# Patient Record
Sex: Female | Born: 1995 | Race: White | Hispanic: No | Smoking: Never smoker
Health system: Southern US, Community
[De-identification: ages and names within clinical notes are randomized; demographics above are authoritative.]

## PROBLEM LIST (undated history)

## (undated) ENCOUNTER — Inpatient Hospital Stay (HOSPITAL_COMMUNITY): Payer: PRIVATE HEALTH INSURANCE

## (undated) DIAGNOSIS — H539 Unspecified visual disturbance: Secondary | ICD-10-CM

---
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
## 2019-01-27 DIAGNOSIS — N854 Malposition of uterus: Secondary | ICD-10-CM | POA: Insufficient documentation

## 2019-01-27 DIAGNOSIS — R030 Elevated blood-pressure reading, without diagnosis of hypertension: Secondary | ICD-10-CM | POA: Insufficient documentation

## 2020-04-12 ENCOUNTER — Emergency Department (HOSPITAL_COMMUNITY): Payer: No Typology Code available for payment source

## 2020-04-12 ENCOUNTER — Emergency Department (HOSPITAL_COMMUNITY)
Admission: EM | Admit: 2020-04-12 | Discharge: 2020-04-12 | Disposition: A | Discharge status: Home / Self Care | Payer: No Typology Code available for payment source | Attending: Emergency Medicine | Admitting: Emergency Medicine

## 2020-04-12 DIAGNOSIS — R109 Unspecified abdominal pain: Secondary | ICD-10-CM | POA: Diagnosis present

## 2020-04-12 DIAGNOSIS — R102 Pelvic and perineal pain: Secondary | ICD-10-CM

## 2020-04-12 DIAGNOSIS — D72829 Elevated white blood cell count, unspecified: Secondary | ICD-10-CM | POA: Insufficient documentation

## 2020-04-12 DIAGNOSIS — N83291 Other ovarian cyst, right side: Secondary | ICD-10-CM | POA: Diagnosis not present

## 2020-04-12 DIAGNOSIS — N83209 Unspecified ovarian cyst, unspecified side: Secondary | ICD-10-CM

## 2020-04-12 DIAGNOSIS — R000 Tachycardia, unspecified: Secondary | ICD-10-CM | POA: Insufficient documentation

## 2020-04-12 LAB — I-STAT BETA HCG BLOOD, ED (MC, WL, AP ONLY): I-stat hCG, quantitative: <5 m[IU]/mL (ref <5)

## 2020-04-12 LAB — CBC WITH DIFFERENTIAL/PLATELET
Abs Immature Granulocytes: 0.01 10*3/uL (ref 0.00–0.07)
Basophils Absolute: 0 10*3/uL (ref 0.0–0.1)
Basophils Relative: 1 %
Eosinophils Absolute: 0.1 10*3/uL (ref 0.0–0.5)
Eosinophils Relative: 1 %
HCT: 39.1 % (ref 36.0–46.0)
Hemoglobin: 12.6 g/dL (ref 12.0–15.0)
Immature Granulocytes: 0 %
Lymphocytes Relative: 34 %
Lymphs Abs: 1.7 10*3/uL (ref 0.7–4.0)
MCH: 28.3 pg (ref 26.0–34.0)
MCHC: 32.2 g/dL (ref 30.0–36.0)
MCV: 87.7 fL (ref 80.0–100.0)
Monocytes Absolute: 0.4 10*3/uL (ref 0.1–1.0)
Monocytes Relative: 8 %
Neutro Abs: 2.8 10*3/uL (ref 1.7–7.7)
Neutrophils Relative %: 56 %
Platelets: 219 10*3/uL (ref 150–400)
RBC: 4.46 MIL/uL (ref 3.87–5.11)
RDW: 14.6 % (ref 11.5–15.5)
WBC: 4.9 10*3/uL (ref 4.0–10.5)
nRBC: 0 % (ref 0.0–0.2)

## 2020-04-12 LAB — URINALYSIS, ROUTINE W REFLEX MICROSCOPIC
Bacteria, UA: NONE SEEN
Bilirubin Urine: NEGATIVE
Glucose, UA: NEGATIVE mg/dL
Ketones, ur: 5 mg/dL — AB
Nitrite: NEGATIVE
Protein, ur: NEGATIVE mg/dL
Specific Gravity, Urine: 1.013 (ref 1.005–1.030)
pH: 6 (ref 5.0–8.0)

## 2020-04-12 LAB — COMPREHENSIVE METABOLIC PANEL
ALT: 29 U/L (ref 0–44)
AST: 31 U/L (ref 15–41)
Albumin: 4 g/dL (ref 3.5–5.0)
Alkaline Phosphatase: 45 U/L (ref 38–126)
Anion gap: 9 (ref 5–15)
BUN: 6 mg/dL (ref 6–20)
CO2: 21 mmol/L — ABNORMAL LOW (ref 22–32)
Calcium: 9.3 mg/dL (ref 8.9–10.3)
Chloride: 106 mmol/L (ref 98–111)
Creatinine, Ser: 0.73 mg/dL (ref 0.44–1.00)
GFR, Estimated: >60 mL/min (ref >60)
Glucose, Bld: 93 mg/dL (ref 70–99)
Potassium: 4 mmol/L (ref 3.5–5.1)
Sodium: 136 mmol/L (ref 135–145)
Total Bilirubin: 0.4 mg/dL (ref 0.3–1.2)
Total Protein: 7.1 g/dL (ref 6.5–8.1)

## 2020-04-12 LAB — LIPASE, BLOOD: Lipase: 33 U/L (ref 11–51)

## 2020-04-12 MED ORDER — HYDROCODONE-ACETAMINOPHEN 5-325 MG PO TABS
1.0000 | ORAL_TABLET | Freq: Once | ORAL | Status: AC
  Administered 2020-04-12: 1 via ORAL
  Filled 2020-04-12: qty 1

## 2020-04-12 NOTE — ED Notes (Signed)
Pt transported to CT ?

## 2020-04-12 NOTE — ED Provider Notes (Signed)
[facility] EMERGENCY DEPARTMENT Provider Note   CSN: [id number] Arrival date & time: 04/12/20  4696     History No chief complaint on file.   Kristina Arias is a 25 y.o. female.  HPI Patient is a 25 year old female who presents the emergency department due to right-sided abdominal pain.  Her symptoms started about 3 days ago.  They were initially intermittent and about 1 day ago they became constant and began worsening.  Her pain is 7/10.  She states it is along the right flank and right lateral abdomen.  Worsens with movement and mildly resolves when lying still.  She has no other somatic complaints.  Denies any chest pain, shortness of breath, fevers, chills, nausea, vomiting, dysuria, hematuria, vaginal discharge.  She notes a history of right-sided ovarian cyst removal but otherwise denies any abdominal surgeries.  Her last menstrual period was around March 4 and she states it was normal.    Past Medical History:  Diagnosis Date  . Vision abnormalities    wears glasses -also has lazy(L) eye and crossed (L) eye    There are no problems to display for this patient.   Past Surgical History:  Procedure Laterality Date  . LAPAROSCOPY  01/15/2011   Procedure: LAPAROSCOPY OPERATIVE;  Surgeon: Janine Limbo, MD;  Location: [facility];  Service: Gynecology;  Laterality: N/A;  . OVARIAN CYST REMOVAL  01/15/2011   Procedure: OVARIAN CYSTECTOMY;  Surgeon: Janine Limbo, MD;  Location: [facility];  Service: Gynecology;  Laterality: Right;  laprascopic ovarian cystectomy with lysis of adhesions     OB History   No obstetric history on file.     No family history on file.  Social History   Tobacco Use  . Smoking status: Never Smoker  Substance Use Topics  . Alcohol use: No    Home Medications Prior to Admission medications   Medication Sig Start Date End Date Taking? Authorizing Provider  JUNEL FE 1/20 1-20 MG-MCG tablet Take 1 tablet by mouth daily.  02/29/20  Yes [provider]  cimetidine (TAGAMET) 800 MG tablet Take 1 tablet (800 mg total) by mouth at bedtime. Patient not taking: No sig reported 06/07/13   Lenn Sink, DPM  ondansetron (ZOFRAN ODT) 4 MG disintegrating tablet Take 1 tablet (4 mg total) by mouth every 8 (eight) hours as needed for nausea or vomiting. Patient not taking: No sig reported 09/02/14   Francee Piccolo, PA-C    Allergies    Patient has no known allergies.  Review of Systems   Review of Systems  All other systems reviewed and are negative. Ten systems reviewed and are negative for acute change, except as noted in the HPI.    Physical Exam Updated Vital Signs BP 128/78   Pulse 77   Temp 98.4 F (36.9 C)   Resp (!) 25   SpO2 99%   Physical Exam Vitals and nursing note reviewed.  Constitutional:      General: She is not in acute distress.    Appearance: Normal appearance. She is not ill-appearing, toxic-appearing or diaphoretic.  HENT:     Head: Normocephalic and atraumatic.     Right Ear: External ear normal.     Left Ear: External ear normal.     Nose: Nose normal.     Mouth/Throat:     Mouth: Mucous membranes are moist.     Pharynx: Oropharynx is clear. No oropharyngeal exudate or posterior oropharyngeal erythema.  Eyes:  General: No scleral icterus.       Right eye: No discharge.        Left eye: No discharge.     Extraocular Movements: Extraocular movements intact.     Conjunctiva/sclera: Conjunctivae normal.  Cardiovascular:     Rate and Rhythm: Regular rhythm. Tachycardia present.     Pulses: Normal pulses.     Heart sounds: Normal heart sounds. No murmur heard. No friction rub. No gallop.   Pulmonary:     Effort: Pulmonary effort is normal. No respiratory distress.     Breath sounds: Normal breath sounds. No stridor. No wheezing, rhonchi or rales.  Abdominal:     General: Abdomen is flat.     Palpations: Abdomen is soft.     Tenderness: There is no abdominal  tenderness. There is no right CVA tenderness or left CVA tenderness.     Comments: Abdomen is flat, soft, and nontender.  No palpable flank pain.  Musculoskeletal:        General: Normal range of motion.     Cervical back: Normal range of motion and neck supple. No tenderness.  Skin:    General: Skin is warm and dry.  Neurological:     General: No focal deficit present.     Mental Status: She is alert and oriented to person, place, and time.  Psychiatric:        Mood and Affect: Mood normal.        Behavior: Behavior normal.    ED Results / Procedures / Treatments   Labs (all labs ordered are listed, but only abnormal results are displayed) Labs Reviewed  COMPREHENSIVE METABOLIC PANEL - Abnormal; Notable for the following components:      Result Value   CO2 21 (*)    All other components within normal limits  URINALYSIS, ROUTINE W REFLEX MICROSCOPIC - Abnormal; Notable for the following components:   APPearance HAZY (*)    Hgb urine dipstick SMALL (*)    Ketones, ur 5 (*)    Leukocytes,Ua LARGE (*)    All other components within normal limits  URINE CULTURE  LIPASE, BLOOD  CBC WITH DIFFERENTIAL/PLATELET  I-STAT BETA HCG BLOOD, ED ([facility], [facility], [facility] ONLY)   EKG None  Radiology CT Renal Stone Study  Result Date: 04/12/2020 CLINICAL DATA:  Right flank pain, suspected kidney stone EXAM: CT ABDOMEN AND PELVIS WITHOUT CONTRAST TECHNIQUE: Multidetector CT imaging of the abdomen and pelvis was performed following the standard protocol without IV contrast. COMPARISON:  03/12/2010 FINDINGS: Lower chest: No acute abnormality. Hepatobiliary: No solid liver abnormality is seen. No gallstones, gallbladder wall thickening, or biliary dilatation. Pancreas: Unremarkable. No pancreatic ductal dilatation or surrounding inflammatory changes. Spleen: Normal in size without significant abnormality. Adrenals/Urinary Tract: Adrenal glands are unremarkable. Small calculi of the inferior pole of the right  kidney (series 6, image 37). No left-sided calculi, ureteral calculi, or hydronephrosis. Bladder is unremarkable. Stomach/Bowel: Stomach is within normal limits. Appendix appears normal. No evidence of bowel wall thickening, distention, or inflammatory changes. Vascular/Lymphatic: No significant vascular findings are present. No enlarged abdominal or pelvic lymph nodes. Reproductive: No mass or other significant abnormality. Fluid attenuation functional cyst of the right ovary (series 3, image 65) Other: No abdominal wall hernia or abnormality. Trace, nonspecific fluid in the low pelvis. Musculoskeletal: No acute or significant osseous findings. IMPRESSION: 1. Small calculi of the inferior pole of the right kidney. No left-sided calculi, ureteral calculi, or hydronephrosis. 2. Fluid attenuation functional cyst of the right  ovary with trace, nonspecific fluid in the low pelvis, likely functional in the reproductive age setting. Electronically Signed   By: Lauralyn Primes M.D.   On: 04/12/2020 11:19   US PELVIC COMPLETE W TRANSVAGINAL AND TORSION R/O  Result Date: 04/12/2020 CLINICAL DATA:  Right pelvic pain EXAM: TRANSABDOMINAL AND TRANSVAGINAL ULTRASOUND OF PELVIS DOPPLER ULTRASOUND OF OVARIES TECHNIQUE: Both transabdominal and transvaginal ultrasound examinations of the pelvis were performed. Transabdominal technique was performed for global imaging of the pelvis including uterus, ovaries, adnexal regions, and pelvic cul-de-sac. It was necessary to proceed with endovaginal exam following the transabdominal exam to visualize the endometrium and ovaries bilaterally. Color and duplex Doppler ultrasound was utilized to evaluate blood flow to the ovaries. COMPARISON:  None. FINDINGS: Uterus Measurements: 7.0 x 4.7 x 5.6 cm = volume: 97 mL. The uterus is retroverted. No fibroids or other mass visualized. The cervix is unremarkable. Endometrium Thickness: 11 mm.  No focal abnormality visualized. Right ovary Measurements:  6.1 x 3.3 x 4.2 cm = volume: 45 mL. The right ovary is largely replaced by at least 5 simple cysts, the largest measuring 3.4 x 3.2 x 3.1 cm likely representing follicular cysts. A single cyst within the right ovary measures 16 x 16 x 17 mm and demonstrates layering echogenic debris demonstrating a concave margin on transverse imaging most in keeping with a small hemorrhagic cyst. No solid masses are identified. Left ovary Measurements: 3.7 x 1.7 x 3.1 cm = volume: 10 mL. Normal appearance/no adnexal mass. Pulsed Doppler evaluation of both ovaries demonstrates normal low-resistance arterial and venous waveforms. Other findings There is small complex appearing fluid within the right adnexa demonstrating echogenic debris likely representing proteinaceous or hemorrhagic debris. IMPRESSION: 17 mm hemorrhagic cyst within the right ovary with small surrounding fluid within the right adnexa likely representing hemorrhagic fluid in keeping with a probable ruptured hemorrhagic cyst. Multiple additional simple cysts within the right ovary measuring up to 3.4 cm. No follow up imaging recommended. Note: This recommendation does not apply to premenarchal patients or to those with increased risk (genetic, family history, elevated tumor markers or other high-risk factors) of ovarian cancer. Reference: Radiology 2019 Nov; 293(2):359-371. Electronically Signed   By: Helyn Numbers MD   On: 04/12/2020 14:14    Procedures Procedures   Medications Ordered in ED Medications  HYDROcodone-acetaminophen (NORCO/VICODIN) 5-325 MG per tablet 1 tablet (1 tablet Oral Given 04/12/20 1041)    ED Course  I have reviewed the triage vital signs and the nursing notes.  Pertinent labs & imaging results that were available during my care of the patient were reviewed by me and considered in my medical decision making (see chart for details).  Clinical Course as of 04/12/20 1445  Thu Apr 12, 2020  1435 US PELVIC COMPLETE W TRANSVAGINAL  AND TORSION R/O IMPRESSION: 17 mm hemorrhagic cyst within the right ovary with small surrounding fluid within the right adnexa likely representing hemorrhagic fluid in keeping with a probable ruptured hemorrhagic cyst.  Multiple additional simple cysts within the right ovary measuring up to 3.4 cm. No follow up imaging recommended. Note: This recommendation does not apply to premenarchal patients or to those with increased risk (genetic, family history, elevated tumor markers or other high-risk factors) of ovarian cancer. Reference: Radiology 2019 Nov; 293(2):359-371. [LJ]    Clinical Course User Index [LJ] Placido Sou, PA-C   MDM Rules/Calculators/A&P  Pt is a 25 y.o. female who presents the emergency department with right-sided abdominal pain and flank pain.  Labs: CBC without abnormalities. CMP with a CO2 of 21. Lipase of 33. I-STAT beta-hCG less than 5. UA showing small hemoglobin, 5 ketones, large leukocytes, 11-20 squamous epithelial cells.  Urine culture sent.  Imaging: CT scan without contrast of the abdomen and pelvis shows small calculi of the inferior pole the right kidney.  No left-sided calculi, ureteral calculi, or hydronephrosis.  There is fluid attenuation and a functional cyst of the right ovary with trace nonspecific fluid in the low pelvis. Ultrasound shows a 17 mm hemorrhagic cyst within the right ovary with small surrounding fluid within the right adnexa likely representing a hemorrhagic fluid in keeping with a probable ruptured hemorrhagic cyst.  I, Placido SouLogan Joldersma, PA-C, personally reviewed and evaluated these images and lab results as part of my medical decision-making.  Feel that patient's symptoms are likely secondary to a ruptured hemorrhagic cyst in the right ovary.  Imaging showed a 17 mm hemorrhagic cyst within the right ovary.  CT and ultrasound otherwise reassuring.  Discussed pain control with the patient.  She has a  gynecologist in town and is going to follow-up with them if her symptoms persist.  We discussed return precautions.  Her questions were answered and she was amicable at the time of discharge.  Note: Portions of this report may have been transcribed using voice recognition software. Every effort was made to ensure accuracy; however, inadvertent computerized transcription errors may be present.   Final Clinical Impression(s) / ED Diagnoses Final diagnoses:  Ruptured cyst of ovary   Rx / DC Orders ED Discharge Orders    None       Placido SouJoldersma, Logan, PA-C 04/12/20 1445    Virgina NorfolkCuratolo, Adam, DO 04/12/20 1503

## 2020-04-12 NOTE — ED Notes (Signed)
Pt d/c home per MD order. Discharge summary reviewed with pt, pt verbalizes understanding. No s/s of acute distress noted at discharge., Ambulatory. Reports she has discharge ride home.

## 2020-04-12 NOTE — ED Triage Notes (Signed)
Pt here with c/o right side flank pain that radiates around to front of her abd , no n/v

## 2020-04-12 NOTE — ED Notes (Signed)
Patient transported to CT 

## 2020-04-12 NOTE — Discharge Instructions (Signed)
I recommend a combination of tylenol and ibuprofen for management of your pain. You can take a low dose of both at the same time. I recommend 500 mg of Tylenol combined with 600 mg of ibuprofen. This is one maximum strength Tylenol and three regular ibuprofen. You can take these 2-3 times for day for your pain. Please try to take these medications with a small amount of food as well to prevent upsetting your stomach.  If you develop any new or worsening symptoms please do not hesitate to return to the emergency department.  Otherwise, please follow-up with your gynecologist regarding your symptoms.  It was a pleasure to meet you.

## 2020-04-13 LAB — URINE CULTURE

## 2021-04-04 ENCOUNTER — Ambulatory Visit: Payer: No Typology Code available for payment source | Admitting: Family

## 2021-05-16 ENCOUNTER — Ambulatory Visit: Payer: BC Managed Care – PPO | Admitting: Family

## 2021-05-16 ENCOUNTER — Encounter: Payer: Self-pay | Admitting: Family

## 2021-05-16 VITALS — BP 122/84 | HR 93 | Temp 99.0°F | Resp 16 | Ht 66.0 in | Wt 171.2 lb

## 2021-05-16 DIAGNOSIS — Z13220 Encounter for screening for lipoid disorders: Secondary | ICD-10-CM | POA: Diagnosis not present

## 2021-05-16 DIAGNOSIS — Z0001 Encounter for general adult medical examination with abnormal findings: Secondary | ICD-10-CM | POA: Diagnosis not present

## 2021-05-16 DIAGNOSIS — N83201 Unspecified ovarian cyst, right side: Secondary | ICD-10-CM | POA: Diagnosis not present

## 2021-05-16 DIAGNOSIS — J029 Acute pharyngitis, unspecified: Secondary | ICD-10-CM | POA: Insufficient documentation

## 2021-05-16 DIAGNOSIS — D509 Iron deficiency anemia, unspecified: Secondary | ICD-10-CM | POA: Diagnosis not present

## 2021-05-16 DIAGNOSIS — J00 Acute nasopharyngitis [common cold]: Secondary | ICD-10-CM | POA: Insufficient documentation

## 2021-05-16 LAB — POCT RAPID STREP A (OFFICE): Rapid Strep A Screen: NEGATIVE

## 2021-05-16 NOTE — Patient Instructions (Addendum)
please check into status of HPV vaccine and or tdap vaccination.  ? ?Continue nightly zyrtec, start using flonase nose spray daily.  ? ?Welcome to our clinic, I am happy to have you as my new patient. I am excited to continue on this healthcare journey with you. ? ?Stop by the lab prior to leaving today. I will notify you of your results once received.  ? ?Please keep in mind ?Any my chart messages you send have p to a three business day turnaround for a response.  ?Phone calls may have up to a one day business turnaround for a  response.  ? ?If you need a medication refill I recommend you request it through the pharmacy as this is easiest for [place] rather than sending a message and or phone call.  ? ?Due to recent changes in healthcare laws, you may see results of your imaging and/or laboratory studies on MyChart before I have had a chance to review them.  I understand that in some cases there may be results that are confusing or concerning to you. Please understand that not all results are received at the same time and often I may need to interpret multiple results in order to provide you with the best plan of care or course of treatment. Therefore, I ask that you please give me 2 business days to thoroughly review all your results before contacting my office for clarification. Should we see a critical lab result, you will be contacted sooner.  ? ?It was a pleasure seeing you today! Please do not hesitate to reach out with any questions and or concerns. ? ?Regards,  ? ?Zaryah Seckel ?FNP-C ? ?

## 2021-05-16 NOTE — Progress Notes (Signed)
? ?New Patient Office Visit ? ?Subjective:  ?Patient ID: Kristina Arias, female    DOB: [birth year]  Age: 26 y.o. MRN: [id number] ? ?CC:  ?Chief Complaint  ?Patient presents with  ? Establish Care  ? ? ?HPI ?Kristina Arias is here to establish care as a new patient. ? ?Prior provider was: Maryellen Pile, Pediatrician. Last visit with PCP 2016 ?Pt is without acute concerns.  ? ?Wears glasses and contacts all the time to see.  ?Tetanus vaccine, unsure if she has had in the last ten years.  ?Hpv vaccination, unsure of status.  ? ?Pt states for the last four days with fullness of bil ears, feels cloudy, nasal congestion, headache, and sore throat that is starting to improve. She does have tonsil stones which have since improved. No cough. No fever no chills.  ?Seems to be improving but only very slight.  ?Taking dayquil with no relief.  ?At home covid test negative.  ? ? ?  05/16/2021  ?  4:08 PM  ?GAD 7 : Generalized Anxiety Score  ?Nervous, Anxious, on Edge 1  ?Control/stop worrying 0  ?Worry too much - different things 1  ?Trouble relaxing 0  ?Restless 0  ?Easily annoyed or irritable 0  ?Afraid - awful might happen 0  ?Total GAD 7 Score 2  ?Anxiety Difficulty Somewhat difficult  ? ? ? ?  05/16/2021  ?  4:08 PM  ?PHQ9 SCORE ONLY  ?PHQ-9 Total Score 1  ? ? ?chronic concerns: ? ?On OCPs sees gynecologist. Last pap 03/2021 ? ?IDA: birth control helps with heavy periods, better now. Taking a daily MVI which has helped as well.  ? ?Ovarian cysts: has had removed when she was 26 y/o. Right side always, reoccur. Ruptured last year as well, pills have helped as well.  ? ?Past Medical History:  ?Diagnosis Date  ? Vision abnormalities   ? wears glasses -also has lazy(L) eye and crossed (L) eye  ? ? ?Past Surgical History:  ?Procedure Laterality Date  ? LAPAROSCOPY  01/15/2011  ? Procedure: LAPAROSCOPY OPERATIVE;  Surgeon: Janine Limbo, MD;  Location: [facility];  Service: Gynecology;  Laterality: N/A;  ? OVARIAN CYST REMOVAL   01/15/2011  ? Procedure: OVARIAN CYSTECTOMY;  Surgeon: Janine Limbo, MD;  Location: [facility];  Service: Gynecology;  Laterality: Right;  laprascopic ovarian cystectomy with lysis of adhesions  ? ? ?Family History  ?Problem Relation Age of Onset  ? Hypertension Mother   ? Thyroid disease Mother   ? Diabetes Maternal Grandmother   ? Hypertension Maternal Grandmother   ? ? ?Social History  ? ?Socioeconomic History  ? Marital status: Single  ?  Spouse name: Not on file  ? Number of children: 0  ? Years of education: Not on file  ? Highest education level: Not on file  ?Occupational History  ? Occupation: first grade teacher  ?Tobacco Use  ? Smoking status: Never  ? Smokeless tobacco: Never  ?Vaping Use  ? Vaping Use: Never used  ?Substance and Sexual Activity  ? Alcohol use: Yes  ?  Comment: socially  ? Drug use: Never  ? Sexual activity: Yes  ?  Partners: Male  ?  Birth control/protection: Pill, OCP  ?  Comment: two years together  ?Other Topics Concern  ? Not on file  ?Social History Narrative  ? 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 ?Social Determinants of Health  ? ?Financial Resource Strain: Not on file  ?  Food Insecurity: Not on file  ?Transportation Needs: Not on file  ?Physical Activity: Not on file  ?Stress: Not on file  ?Social Connections: Not on file  ?Intimate Partner Violence: Not on file  ? ? ?Outpatient Medications Prior to Visit  ?Medication Sig Dispense Refill  ? cetirizine (ZYRTEC) 10 MG chewable tablet Chew 10 mg by mouth daily.    ? LO LOESTRIN FE 1 MG-10 MCG / 10 MCG tablet Take 1 tablet by mouth daily.    ? Multiple Vitamin (MULTIVITAMIN) tablet Take 1 tablet by mouth daily.    ? cimetidine (TAGAMET) 800 MG tablet Take 1 tablet (800 mg total) by mouth at bedtime. (Patient not taking: Reported on 04/12/2020) 30 tablet 1  ? JUNEL FE 1/20 1-20 MG-MCG tablet Take 1 tablet by mouth daily. (Patient not taking: Reported on 05/16/2021)    ? ondansetron (ZOFRAN ODT) 4 MG  disintegrating tablet Take 1 tablet (4 mg total) by mouth every 8 (eight) hours as needed for nausea or vomiting. (Patient not taking: Reported on 04/12/2020) 20 tablet 0  ? ?No facility-administered medications prior to visit.  ? ? ?No Known Allergies ? ?ROS ?Review of Systems  ?Constitutional:  Negative for chills, fatigue and fever.  ?HENT:  Positive for congestion, ear pain (bil ear fullness), postnasal drip and sore throat. Negative for sinus pressure and sinus pain.   ?Respiratory:  Negative for cough and shortness of breath.   ?Cardiovascular:  Negative for chest pain and leg swelling.  ?Gastrointestinal:  Negative for diarrhea and nausea.  ?Genitourinary:  Positive for menstrual problem (periods heavy, but improving with birth control pills). Negative for difficulty urinating.  ?Psychiatric/Behavioral:  Negative for agitation and sleep disturbance.   ?All other systems reviewed and are negative. ? ?. ? ?  ?Objective:  ?  ?Physical Exam ?Vitals reviewed.  ?Constitutional:   ?   General: She is not in acute distress. ?   Appearance: Normal appearance. She is not ill-appearing or toxic-appearing.  ?HENT:  ?   Right Ear: Tympanic membrane normal.  ?   Left Ear: Tympanic membrane normal.  ?   Nose: Congestion present.  ?   Right Turbinates: Enlarged and swollen.  ?   Left Turbinates: Enlarged and swollen.  ?   Right Sinus: No maxillary sinus tenderness or frontal sinus tenderness.  ?   Left Sinus: No maxillary sinus tenderness or frontal sinus tenderness.  ?   Mouth/Throat:  ?   Mouth: Mucous membranes are moist.  ?   Pharynx: Posterior oropharyngeal erythema present. No pharyngeal swelling.  ?   Tonsils: No tonsillar exudate.  ?Eyes:  ?   Extraocular Movements: Extraocular movements intact.  ?   Conjunctiva/sclera: Conjunctivae normal.  ?   Pupils: Pupils are equal, round, and reactive to light.  ?Neck:  ?   Thyroid: No thyroid mass.  ?Cardiovascular:  ?   Rate and Rhythm: Normal rate and regular rhythm.   ?Pulmonary:  ?   Effort: Pulmonary effort is normal.  ?   Breath sounds: Normal breath sounds.  ?Musculoskeletal:     ?   General: Normal range of motion.  ?Lymphadenopathy:  ?   Cervical:  ?   Right cervical: No superficial cervical adenopathy. ?   Left cervical: No superficial cervical adenopathy.  ?Skin: ?   General: Skin is warm.  ?   Capillary Refill: Capillary refill takes less than 2 seconds.  ?Neurological:  ?   General: No focal deficit present.  ?  Mental Status: She is alert and oriented to person, place, and time.  ?Psychiatric:     ?   Mood and Affect: Mood normal.     ?   Behavior: Behavior normal.     ?   Thought Content: Thought content normal.     ?   Judgment: Judgment normal.  ? ? ? ? ?BP 122/84   Pulse 93   Temp 99 ?F (37.2 ?C)   Resp 16   Ht 5\' 6"  (1.676 m)   Wt 171 lb 3 oz (77.7 kg)   LMP 04/22/2021 (Exact Date)   SpO2 98%   BMI 27.63 kg/m?  ?Wt Readings from Last 3 Encounters:  ?05/16/21 171 lb 3 oz (77.7 kg)  ?09/01/14 148 lb 8 oz (67.4 kg) (85 %, Z= 1.05)*  ?06/07/13 130 lb (59 kg) (71 %, Z= 0.56)*  ? ?* Growth percentiles are based on CDC (Girls, 2-20 Years) data.  ? ? ? ?Health Maintenance Due  ?Topic Date Due  ? HPV VACCINES (1 - 2-dose series) Never done  ? TETANUS/TDAP  Never done  ? ? ?   ?Topic Date Due  ? HPV VACCINES (1 - 2-dose series) Never done  ? ? ?No results found for: TSH ?Lab Results  ?Component Value Date  ? WBC 4.9 04/12/2020  ? HGB 12.6 04/12/2020  ? HCT 39.1 04/12/2020  ? MCV 87.7 04/12/2020  ? PLT 219 04/12/2020  ? ?Lab Results  ?Component Value Date  ? NA 136 04/12/2020  ? K 4.0 04/12/2020  ? CO2 21 (L) 04/12/2020  ? GLUCOSE 93 04/12/2020  ? BUN 6 04/12/2020  ? CREATININE 0.73 04/12/2020  ? BILITOT 0.4 04/12/2020  ? ALKPHOS 45 04/12/2020  ? AST 31 04/12/2020  ? ALT 29 04/12/2020  ? PROT 7.1 04/12/2020  ? ALBUMIN 4.0 04/12/2020  ? CALCIUM 9.3 04/12/2020  ? ANIONGAP 9 04/12/2020  ? ?No results found for: CHOL ?No results found for: HDL ?No results found for:  LDLCALC ?No results found for: TRIG ?No results found for: CHOLHDL ?No results found for: HGBA1C ? ?  ?Assessment & Plan:  ? ?Problem List Items Addressed This Visit   ? ?  ? Respiratory  ? Acute nasopharyngitis  ?  Advised patient

## 2021-05-16 NOTE — Assessment & Plan Note (Signed)
Continue ocps ?Continue f/u with gyn prn ?

## 2021-05-16 NOTE — Assessment & Plan Note (Signed)
Patient Counseling(The following topics were reviewed): ? Preventative care handout given to pt  ?Health maintenance and immunizations reviewed. Please refer to Health maintenance section. ?Pt advised on safe sex, wearing seatbelts in car, and proper nutrition ?labwork ordered today for annual ?Dental health: Discussed importance of regular tooth brushing, flossing, and dental visits. ? ? ?

## 2021-05-16 NOTE — Assessment & Plan Note (Addendum)
poct strep in office, negative. ?

## 2021-05-16 NOTE — Assessment & Plan Note (Signed)
Ordering cbc pending results ?

## 2021-05-16 NOTE — Assessment & Plan Note (Signed)
Advised patient on supportive measures:  Be sure to rest, drink plenty of fluids, and use tylenol or ibuprofen as needed for pain. Follow up if fever >101, if symptoms worsen or if symptoms are not improved in 3 days. Patient verbalizes understanding.   

## 2021-05-16 NOTE — Assessment & Plan Note (Signed)
Lipid panel ordered pending results.   

## 2021-05-17 LAB — CBC WITH DIFFERENTIAL/PLATELET
Basophils Absolute: 0.1 10*3/uL (ref 0.0–0.1)
Basophils Relative: 1.2 % (ref 0.0–3.0)
Eosinophils Absolute: 0.2 10*3/uL (ref 0.0–0.7)
Eosinophils Relative: 4.9 % (ref 0.0–5.0)
HCT: 37.7 % (ref 36.0–46.0)
Hemoglobin: 12.4 g/dL (ref 12.0–15.0)
Lymphocytes Relative: 30.6 % (ref 12.0–46.0)
Lymphs Abs: 1.3 10*3/uL (ref 0.7–4.0)
MCHC: 32.8 g/dL (ref 30.0–36.0)
MCV: 88.7 fl (ref 78.0–100.0)
Monocytes Absolute: 0.7 10*3/uL (ref 0.1–1.0)
Monocytes Relative: 16.7 % — ABNORMAL HIGH (ref 3.0–12.0)
Neutro Abs: 2 10*3/uL (ref 1.4–7.7)
Neutrophils Relative %: 46.6 % (ref 43.0–77.0)
Platelets: 209 10*3/uL (ref 150.0–400.0)
RBC: 4.25 Mil/uL (ref 3.87–5.11)
RDW: 14.1 % (ref 11.5–15.5)
WBC: 4.3 10*3/uL (ref 4.0–10.5)

## 2021-05-17 LAB — COMPREHENSIVE METABOLIC PANEL
ALT: 15 U/L (ref 0–35)
AST: 26 U/L (ref 0–37)
Albumin: 4.3 g/dL (ref 3.5–5.2)
Alkaline Phosphatase: 51 U/L (ref 39–117)
BUN: 9 mg/dL (ref 6–23)
CO2: 27 mEq/L (ref 19–32)
Calcium: 9.1 mg/dL (ref 8.4–10.5)
Chloride: 103 mEq/L (ref 96–112)
Creatinine, Ser: 0.64 mg/dL (ref 0.40–1.20)
GFR: 124.54 mL/min (ref >60.00)
Glucose, Bld: 71 mg/dL (ref 70–99)
Potassium: 4 mEq/L (ref 3.5–5.1)
Sodium: 138 mEq/L (ref 135–145)
Total Bilirubin: 0.2 mg/dL (ref 0.2–1.2)
Total Protein: 7.1 g/dL (ref 6.0–8.3)

## 2021-05-17 LAB — LIPID PANEL
Cholesterol: 167 mg/dL (ref 0–200)
HDL: 53.9 mg/dL (ref >39.00)
LDL Cholesterol: 98 mg/dL (ref 0–99)
NonHDL: 112.72
Total CHOL/HDL Ratio: 3
Triglycerides: 75 mg/dL (ref 0.0–149.0)
VLDL: 15 mg/dL (ref 0.0–40.0)

## 2021-05-17 NOTE — Progress Notes (Signed)
Labs are acceptable range ?Monocytes elevated, expected with recent upper respiratory/cold symptoms.

## 2022-02-26 DIAGNOSIS — H50112 Monocular exotropia, left eye: Secondary | ICD-10-CM | POA: Insufficient documentation

## 2022-05-19 ENCOUNTER — Ambulatory Visit (INDEPENDENT_AMBULATORY_CARE_PROVIDER_SITE_OTHER): Payer: BC Managed Care – PPO | Admitting: Family

## 2022-05-19 ENCOUNTER — Encounter: Payer: Self-pay | Admitting: Family

## 2022-05-19 ENCOUNTER — Encounter: Payer: BC Managed Care – PPO | Admitting: Family

## 2022-05-19 VITALS — BP 124/72 | HR 83 | Temp 98.3°F | Ht 66.0 in | Wt 181.4 lb

## 2022-05-19 DIAGNOSIS — Z0000 Encounter for general adult medical examination without abnormal findings: Secondary | ICD-10-CM

## 2022-05-19 DIAGNOSIS — R350 Frequency of micturition: Secondary | ICD-10-CM

## 2022-05-19 DIAGNOSIS — R300 Dysuria: Secondary | ICD-10-CM | POA: Diagnosis not present

## 2022-05-19 DIAGNOSIS — Z23 Encounter for immunization: Secondary | ICD-10-CM | POA: Diagnosis not present

## 2022-05-19 LAB — CBC
HCT: 39.9 % (ref 36.0–46.0)
Hemoglobin: 13.2 g/dL (ref 12.0–15.0)
MCHC: 33.1 g/dL (ref 30.0–36.0)
MCV: 90.1 fl (ref 78.0–100.0)
Platelets: 295 10*3/uL (ref 150.0–400.0)
RBC: 4.43 Mil/uL (ref 3.87–5.11)
RDW: 12.3 % (ref 11.5–15.5)
WBC: 7.4 10*3/uL (ref 4.0–10.5)

## 2022-05-19 LAB — BASIC METABOLIC PANEL
BUN: 9 mg/dL (ref 6–23)
CO2: 26 mEq/L (ref 19–32)
Calcium: 9.4 mg/dL (ref 8.4–10.5)
Chloride: 102 mEq/L (ref 96–112)
Creatinine, Ser: 0.64 mg/dL (ref 0.40–1.20)
GFR: 123.67 mL/min (ref >60.00)
Glucose, Bld: 93 mg/dL (ref 70–99)
Potassium: 3.5 mEq/L (ref 3.5–5.1)
Sodium: 137 mEq/L (ref 135–145)

## 2022-05-19 NOTE — Assessment & Plan Note (Signed)
R/o uti  Pt declines std testing

## 2022-05-19 NOTE — Patient Instructions (Addendum)
  Recommend hpv vaccination.   Recommend adding on flonase daily for the allergy season.   Tetanus will be good for ten years as you received it today.   Stop by the lab prior to leaving today. I will notify you of your results once received.   Stop by the lab prior to leaving today. I will notify you of your results once received.   Recommendations on keeping yourself healthy:  - Exercise at least 30-45 minutes a day, 3-4 days a week.  - Eat a low-fat diet with lots of fruits and vegetables, up to 7-9 servings per day.  - Seatbelts can save your life. Wear them always.  - Smoke detectors on every level of your home, check batteries every year.  - Eye Doctor - have an eye exam every 1-2 years  - Safe sex - if you may be exposed to STDs, use a condom.  - Alcohol -  If you drink, do it moderately, less than 2 drinks per day.  - Health Care Power of Attorney. Choose someone to speak for you if you are not able.  - Depression is common in our stressful world.If you're feeling down or losing interest in things you normally enjoy, please come in for a visit.  - Violence - If anyone is threatening or hurting you, please call immediately.  Due to recent changes in healthcare laws, you may see results of your imaging and/or laboratory studies on MyChart before I have had a chance to review them.  I understand that in some cases there may be results that are confusing or concerning to you. Please understand that not all results are received at the same time and often I may need to interpret multiple results in order to provide you with the best plan of care or course of treatment. Therefore, I ask that you please give me 2 business days to thoroughly review all your results before contacting my office for clarification. Should we see a critical lab result, you will be contacted sooner.   I will see you again in one year for your annual comprehensive exam unless otherwise stated and or with acute  concerns.  It was a pleasure seeing you today! Please do not hesitate to reach out with any questions and or concerns.  Regards,   Mort Sawyers

## 2022-05-19 NOTE — Assessment & Plan Note (Signed)
Patient Counseling(The following topics were reviewed):  Preventative care handout given to pt  Health maintenance and immunizations reviewed. Please refer to Health maintenance section. Pt advised on safe sex, wearing seatbelts in car, and proper nutrition labwork ordered today for annual Dental health: Discussed importance of regular tooth brushing, flossing, and dental visits.  Tetanus vaccine in office today. Hpv vaccine declined for today

## 2022-05-19 NOTE — Progress Notes (Unsigned)
Subjective:  Patient ID: Kristina Arias, female    DOB: Oct 27, 1995  Age: 27 y.o. MRN: [id number]  Patient Care Team: Mort Sawyers, FNP as PCP - General (Family Medicine)   CC:  Chief Complaint  Patient presents with   Annual Exam    HPI Kristina Arias is a 27 y.o. female who presents today for an annual physical exam. She reports consuming a general diet. The patient has a physically strenuous job, but has no regular exercise apart from work.  She is walking several times a week  She generally feels well. She reports sleeping fairly well. She does not have additional problems to discuss today.   Vision:Within last year Dental:Receives regular dental care STD:The patient denies history of sexually transmitted disease.    Hpv vaccine: she is not sure.   Last pap: 03/2021, also 03/2022 negative per pt.  Gyn: [name]   Pt is with acute concerns.  Has noticed that she is having to pee more frequency and at times will burn when she has to pee. Has been going on for a few weeks. She declines std testing at this time. 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 Limited caffeine intake, at times tea. Does drink a good amount of water and pees after sex.    Advanced Directives Patient does not have advanced directives including  n/a . She does not have a copy in the electronic medical record.   DEPRESSION SCREENING    05/16/2021    4:08 PM  PHQ 2/9 Scores  PHQ - 2 Score 0  PHQ- 9 Score 1     ROS: Negative unless specifically indicated above in HPI.    Current Outpatient Medications:    cetirizine (ZYRTEC) 10 MG chewable tablet, Chew 10 mg by mouth daily., Disp: , Rfl:    Multiple Vitamin (MULTIVITAMIN) tablet, Take 1 tablet by mouth daily., Disp: , Rfl:    norethindrone-ethinyl estradiol-FE (JUNEL FE 1/20) 1-20 MG-MCG tablet, Take 1 tablet by mouth daily., Disp: , Rfl:     Objective:    BP 124/72 (BP Location: Left Arm)   Pulse 83    Temp 98.3 F (36.8 C) (Temporal)   Ht 5\' 6"  (1.676 m)   Wt 181 lb 6.4 oz (82.3 kg)   LMP 05/12/2022   SpO2 98%   BMI 29.28 kg/m   BP Readings from Last 3 Encounters:  05/19/22 124/72  05/16/21 122/84  04/12/20 128/78      Physical Exam Constitutional:      General: She is not in acute distress.    Appearance: Normal appearance. She is normal weight. She is not ill-appearing.  HENT:     Head: Normocephalic.     Right Ear: Tympanic membrane normal.     Left Ear: Tympanic membrane normal.     Nose: Nose normal.     Mouth/Throat:     Mouth: Mucous membranes are moist.  Eyes:     Extraocular Movements: Extraocular movements intact.     Pupils: Pupils are equal, round, and reactive to light.  Cardiovascular:     Rate and Rhythm: Normal rate and regular rhythm.  Pulmonary:     Effort: Pulmonary effort is normal.     Breath sounds: Normal breath sounds.  Abdominal:     General: Abdomen is flat. Bowel sounds are normal.     Palpations: Abdomen is soft.     Tenderness: There is no  guarding or rebound.  Musculoskeletal:        General: Normal range of motion.     Cervical back: Normal range of motion.  Skin:    General: Skin is warm.     Capillary Refill: Capillary refill takes less than 2 seconds.  Neurological:     General: No focal deficit present.     Mental Status: She is alert.  Psychiatric:        Mood and Affect: Mood normal.        Behavior: Behavior normal.        Thought Content: Thought content normal.        Judgment: Judgment normal.          Assessment & Plan:  Encounter for general adult medical examination without abnormal findings Assessment & Plan: Patient Counseling(The following topics were reviewed):  Preventative care handout given to pt  Health maintenance and immunizations reviewed. Please refer to Health maintenance section. Pt advised on safe sex, wearing seatbelts in car, and proper nutrition labwork ordered today for  annual Dental health: Discussed importance of regular tooth brushing, flossing, and dental visits.  Tetanus vaccine in office today. Hpv vaccine declined for today   Orders: -     CBC -     Basic metabolic panel  Urinary frequency Assessment & Plan: R/o uti  Pt declines std testing  Orders: -     Urinalysis, Routine w reflex microscopic -     Urinalysis w microscopic + reflex cultur  Dysuria -     Urinalysis, Routine w reflex microscopic      Follow-up: Return in about 1 year (around 05/19/2023) for f/u CPE.   Mort Sawyers, FNP

## 2022-05-20 LAB — URINALYSIS W MICROSCOPIC + REFLEX CULTURE
Glucose, UA: NEGATIVE
Hyaline Cast: NONE SEEN /LPF
Ketones, ur: NEGATIVE
Nitrites, Initial: NEGATIVE
Protein, ur: NEGATIVE
Specific Gravity, Urine: 1.005 (ref 1.001–1.035)

## 2022-05-21 LAB — URINALYSIS W MICROSCOPIC + REFLEX CULTURE
Bacteria, UA: NONE SEEN /HPF
Bilirubin Urine: NEGATIVE
RBC / HPF: NONE SEEN /HPF (ref 0–2)
Squamous Epithelial / HPF: NONE SEEN /HPF (ref <=5)
WBC, UA: NONE SEEN /HPF (ref 0–5)
pH: 7 (ref 5.0–8.0)

## 2022-05-21 LAB — URINE CULTURE
MICRO NUMBER:: 14888810
Result:: NO GROWTH
SPECIMEN QUALITY:: ADEQUATE

## 2022-05-21 LAB — CULTURE INDICATED

## 2022-09-12 IMAGING — US US PELVIS COMPLETE TRANSABD/TRANSVAG W DUPLEX
1 series · 13 of 25 positions shown · non-contrast
Comparison: None.

CLINICAL DATA: Right pelvic pain

EXAM:
TRANSABDOMINAL AND TRANSVAGINAL ULTRASOUND OF PELVIS
DOPPLER ULTRASOUND OF OVARIES
TECHNIQUE: Both transabdominal and transvaginal ultrasound examinations of the
pelvis were performed. Transabdominal technique was performed for
global imaging of the pelvis including uterus, ovaries, adnexal
regions, and pelvic cul-de-sac.
It was necessary to proceed with endovaginal exam following the
transabdominal exam to visualize the endometrium and ovaries
bilaterally. Color and duplex Doppler ultrasound was utilized to
evaluate blood flow to the ovaries.

[Series 1: us pelvic complete w transvaginal and torsion righ · 13 of 120 slices shown]
[im 1/120]
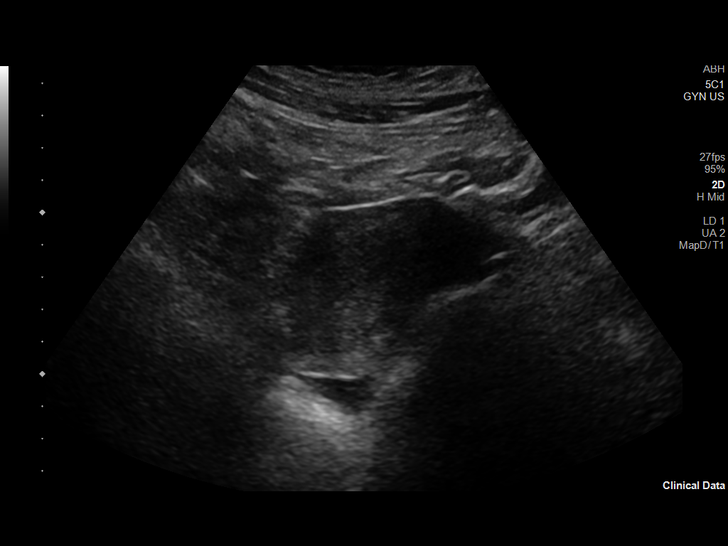
[im 10/120]
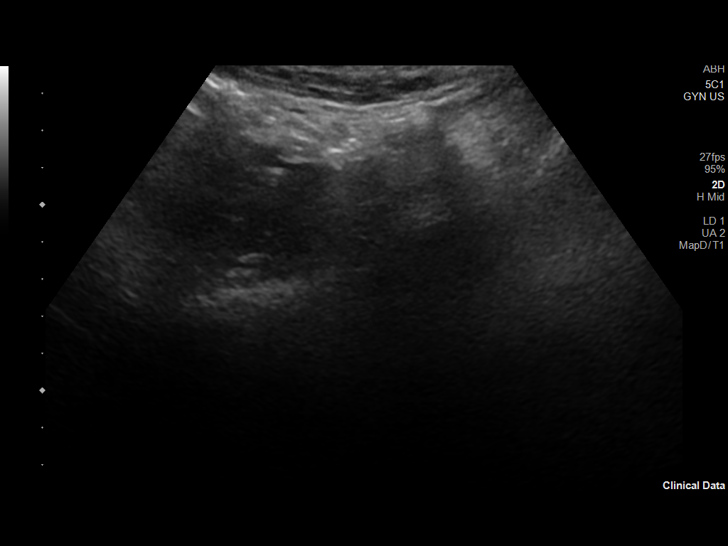
[im 20/120]
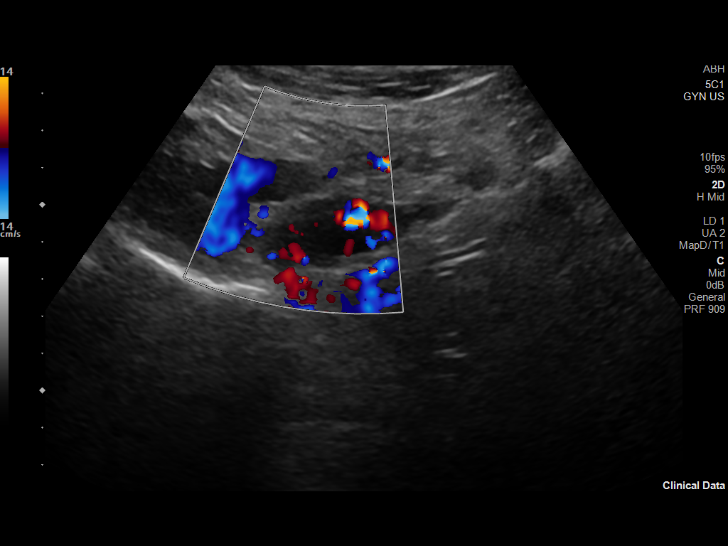
[im 30/120]
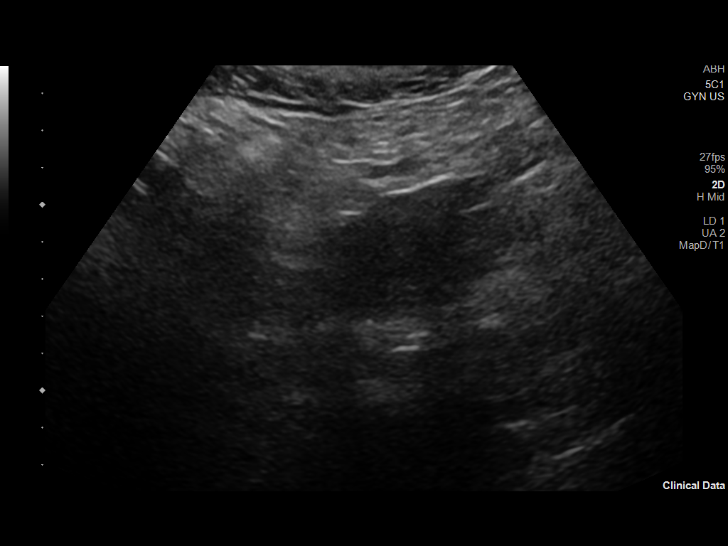
[im 40/120]
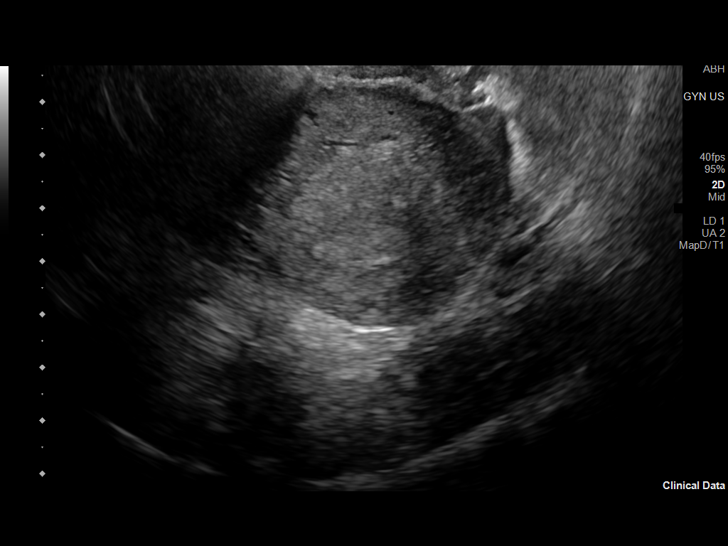
[im 50/120]
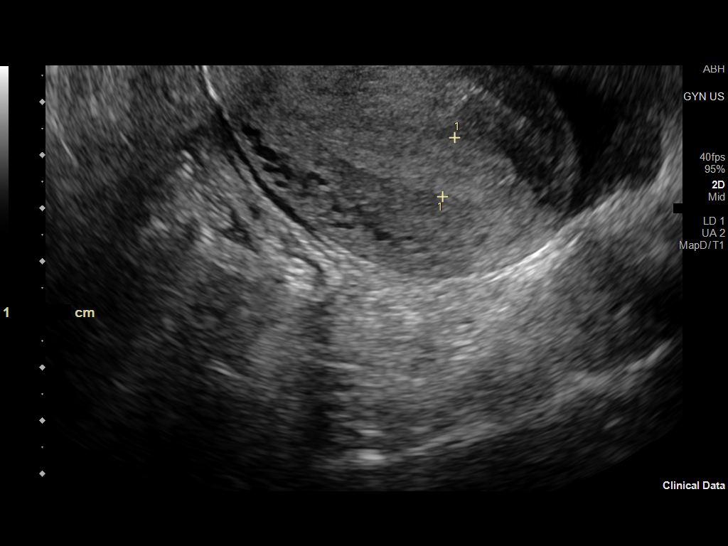
[im 60/120]
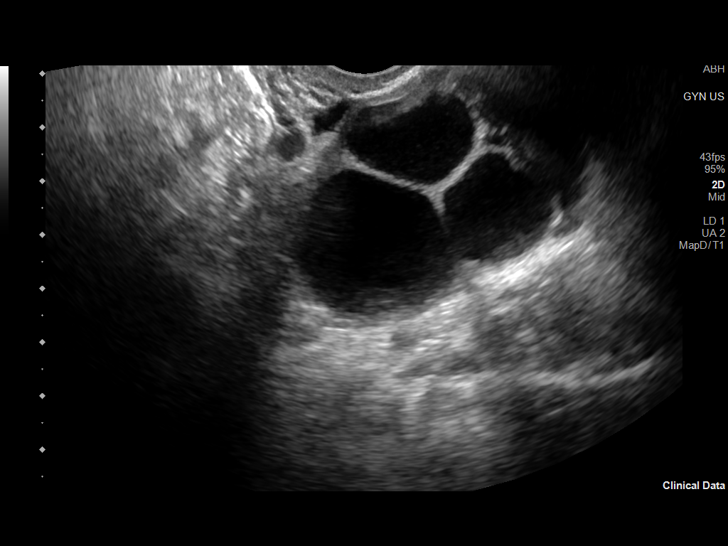
[im 70/120]
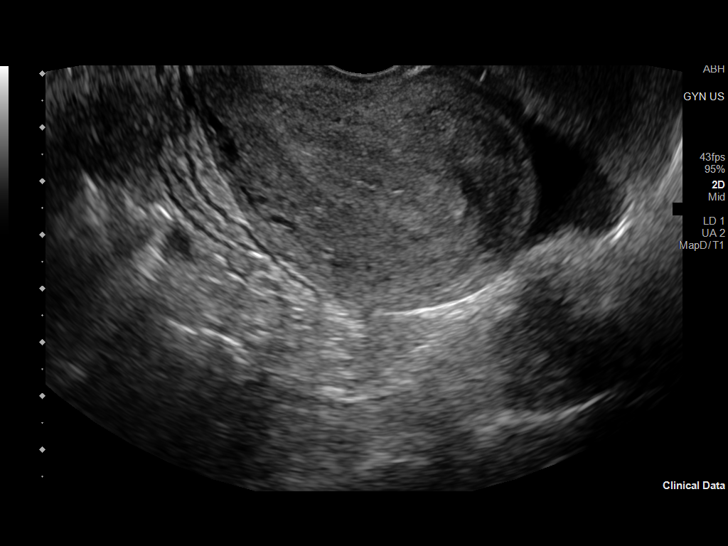
[im 80/120]
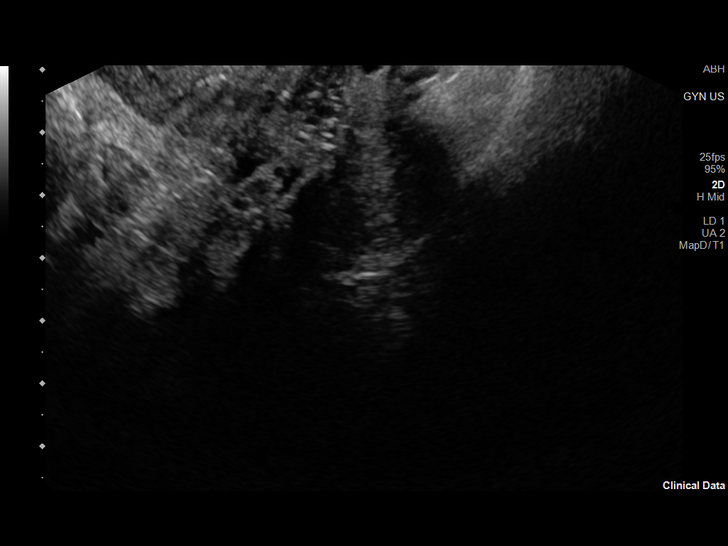
[im 90/120]
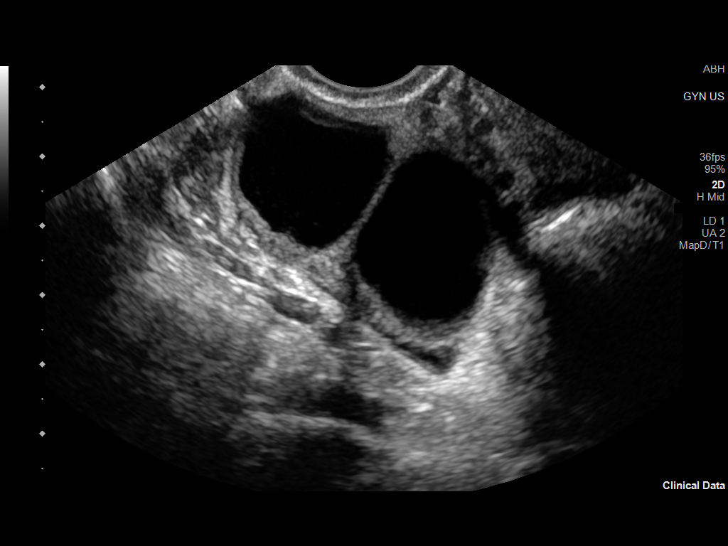
[im 100/120]
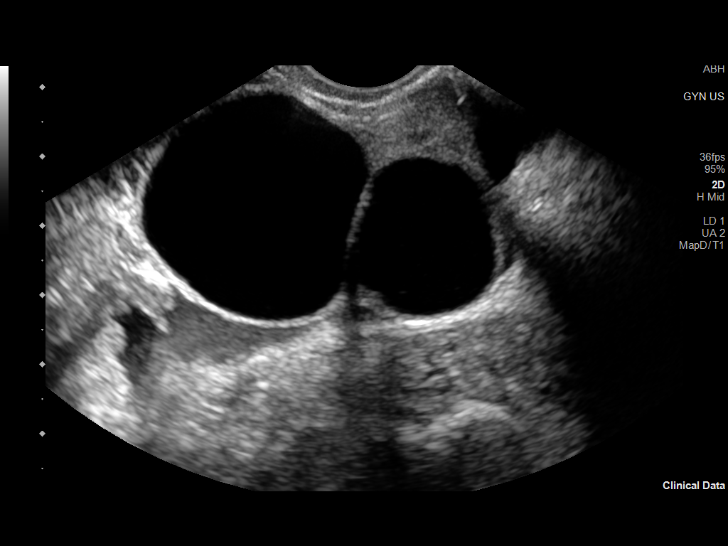
[im 110/120]
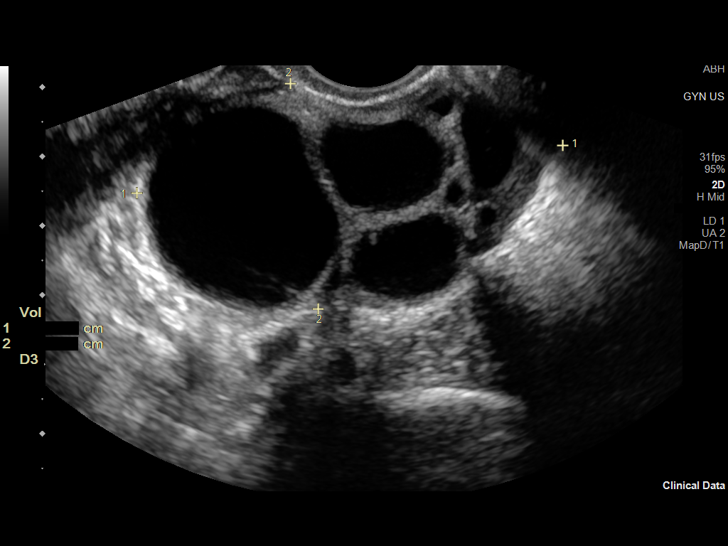
[im 120/120]
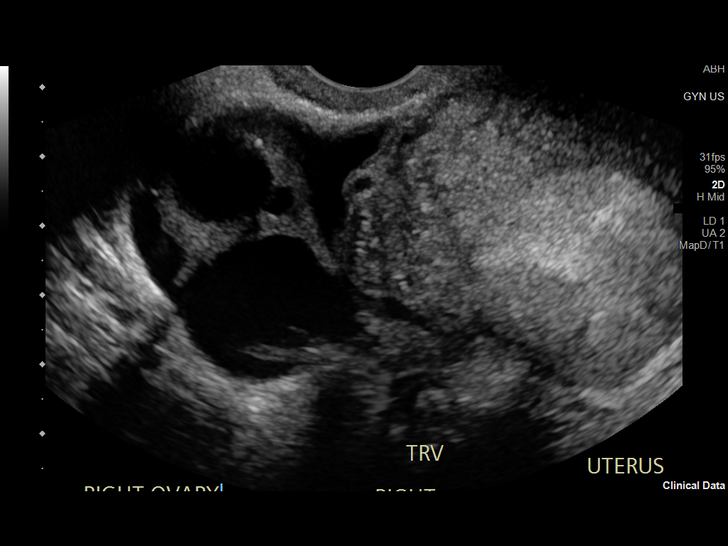

[13 of 25 positions shown; findings below may reference images not displayed]

FINDINGS: Uterus

Measurements: 7.0 x 4.7 x 5.6 cm = volume: 97 mL. The uterus is
retroverted. No fibroids or other mass visualized. The cervix is
unremarkable.

Endometrium

Thickness: 11 mm.  No focal abnormality visualized.

Right ovary

Measurements: 6.1 x 3.3 x 4.2 cm = volume: 45 mL. The right ovary is
largely replaced by at least 5 simple cysts, the largest measuring
3.4 x 3.2 x 3.1 cm likely representing follicular cysts. A single
cyst within the right ovary measures 16 x 16 x 17 mm and
demonstrates layering echogenic debris demonstrating a concave
margin on transverse imaging most in keeping with a small
hemorrhagic cyst. No solid masses are identified.

Left ovary

Measurements: 3.7 x 1.7 x 3.1 cm = volume: 10 mL. Normal
appearance/no adnexal mass.

Pulsed Doppler evaluation of both ovaries demonstrates normal
low-resistance arterial and venous waveforms.

Other findings

There is small complex appearing fluid within the right adnexa
demonstrating echogenic debris likely representing proteinaceous or
hemorrhagic debris.
IMPRESSION: 17 mm hemorrhagic cyst within the right ovary with small surrounding
fluid within the right adnexa likely representing hemorrhagic fluid
in keeping with a probable ruptured hemorrhagic cyst.

Multiple additional simple cysts within the right ovary measuring up
to 3.4 cm. No follow up imaging recommended. Note: This
recommendation does not apply to premenarchal patients or to those
with increased risk (genetic, family history, elevated tumor markers
or other high-risk factors) of ovarian cancer. Reference: Radiology
[DATE]):[id number].

## 2023-10-04 ENCOUNTER — Other Ambulatory Visit: Payer: Self-pay

## 2023-10-04 ENCOUNTER — Encounter (HOSPITAL_COMMUNITY): Payer: Self-pay

## 2023-10-04 ENCOUNTER — Emergency Department (HOSPITAL_COMMUNITY)
Admission: EM | Admit: 2023-10-04 | Discharge: 2023-10-04 | Disposition: A | Discharge status: Home / Self Care | Attending: Emergency Medicine | Admitting: Emergency Medicine

## 2023-10-04 ENCOUNTER — Emergency Department (HOSPITAL_COMMUNITY)

## 2023-10-04 DIAGNOSIS — R079 Chest pain, unspecified: Secondary | ICD-10-CM

## 2023-10-04 DIAGNOSIS — D72829 Elevated white blood cell count, unspecified: Secondary | ICD-10-CM | POA: Diagnosis not present

## 2023-10-04 DIAGNOSIS — R0789 Other chest pain: Secondary | ICD-10-CM | POA: Insufficient documentation

## 2023-10-04 LAB — BASIC METABOLIC PANEL WITH GFR
Anion gap: 13 (ref 5–15)
BUN: 12 mg/dL (ref 6–20)
CO2: 23 mmol/L (ref 22–32)
Calcium: 9.4 mg/dL (ref 8.9–10.3)
Chloride: 103 mmol/L (ref 98–111)
Creatinine, Ser: 0.77 mg/dL (ref 0.44–1.00)
GFR, Estimated: >60 mL/min (ref >60)
Glucose, Bld: 94 mg/dL (ref 70–99)
Potassium: 3.8 mmol/L (ref 3.5–5.1)
Sodium: 139 mmol/L (ref 135–145)

## 2023-10-04 LAB — CBC
HCT: 42.7 % (ref 36.0–46.0)
Hemoglobin: 14 g/dL (ref 12.0–15.0)
MCH: 29.9 pg (ref 26.0–34.0)
MCHC: 32.8 g/dL (ref 30.0–36.0)
MCV: 91 fL (ref 80.0–100.0)
Platelets: 269 K/uL (ref 150–400)
RBC: 4.69 MIL/uL (ref 3.87–5.11)
RDW: 12.3 % (ref 11.5–15.5)
WBC: 12.3 K/uL — ABNORMAL HIGH (ref 4.0–10.5)
nRBC: 0 % (ref 0.0–0.2)

## 2023-10-04 LAB — HCG, SERUM, QUALITATIVE: Preg, Serum: NEGATIVE

## 2023-10-04 LAB — TROPONIN I (HIGH SENSITIVITY): Troponin I (High Sensitivity): 4 ng/L (ref <18)

## 2023-10-04 MED ORDER — ACETAMINOPHEN 325 MG PO TABS: 650.0000 mg | ORAL_TABLET | Freq: Four times a day (QID) | ORAL | 0 refills | Status: DC | PRN

## 2023-10-04 MED ORDER — CYCLOBENZAPRINE HCL 10 MG PO TABS: 10.0000 mg | ORAL_TABLET | Freq: Two times a day (BID) | ORAL | 0 refills | Status: DC | PRN

## 2023-10-04 MED ORDER — IBUPROFEN 600 MG PO TABS: 600.0000 mg | ORAL_TABLET | Freq: Four times a day (QID) | ORAL | 0 refills | Status: DC | PRN

## 2023-10-04 NOTE — Discharge Instructions (Addendum)
Return to the Emergency Department if you have unusual chest pain, pressure, or discomfort, shortness of breath, nausea, vomiting, burping, heartburn, tingling upper body parts, sweating, cold, clammy skin, or racing heartbeat. Call 911 if you think you are having a heart attack. Take all cardiac medications as prescribed - notify your doctor if you have any side effects. Follow cardiac diet - avoid fatty & fried foods, don't eat too much red meat, eat lots of fruits & vegetables, and dairy products should be low fat. Please lose weight if you are overweight. Become more active with walking, gardening, or any other activity that gets you to moving.   Please return to the emergency department immediately for any new or concerning symptoms, or if you get worse. 

## 2023-10-04 NOTE — ED Provider Notes (Addendum)
 [place] EMERGENCY DEPARTMENT AT [facility] Provider Note  CSN: [id number] Arrival date & time: 10/04/23 1357  Chief Complaint(s) Chest Pain and Shortness of Breath  HPI Kristina Arias is a 28 y.o. female with without significant past medical history who presents to the ED with complaint of chest pain  Patient reports she developed left-sided chest pain, sternal chest pain around 24 hours ago, feels the pain is radiating to her left shoulder.  Feels the pain also wraps around to her back.  Pain is aching, sharp.  Denies similar symptoms in the past.  Denies any palpitations or dyspnea, no syncope or near syncope.  No leg swelling.  No significant cardiac history.  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 Authorizing Provider  acetaminophen  (TYLENOL ) 325 MG tablet Take 2 tablets (650 mg total) by mouth every 6 (six) hours as needed. 10/04/23   Elnor Jayson LABOR, DO  cetirizine (ZYRTEC) 10 MG chewable tablet Chew 10 mg by mouth daily.    [provider]  cyclobenzaprine  (FLEXERIL ) 10 MG tablet Take 1 tablet (10 mg total) by mouth 2 (two) times daily as needed for muscle spasms. 10/04/23   Elnor Jayson LABOR, DO  ibuprofen  (ADVIL ) 600 MG tablet Take 1 tablet (600 mg total) by mouth every 6 (six) hours as needed. 10/04/23   Elnor Jayson LABOR, DO  Multiple Vitamin (MULTIVITAMIN) tablet Take 1 tablet by mouth daily.    [provider]  norethindrone-ethinyl estradiol-FE (JUNEL FE 1/20) 1-20 MG-MCG tablet Take 1 tablet by mouth daily. 12/16/21   [provider]                                                                                                                                    Past Surgical History Past Surgical History:  Procedure Laterality Date   LAPAROSCOPY  01/15/2011   Procedure: LAPAROSCOPY OPERATIVE;  Surgeon: Rome LULLA Rigg, MD;  Location: [facility];  Service: Gynecology;  Laterality: N/A;   OVARIAN CYST REMOVAL  01/15/2011   Procedure: OVARIAN CYSTECTOMY;  Surgeon: Rome LULLA Rigg, MD;  Location: [facility];  Service: Gynecology;  Laterality: Right;  laprascopic ovarian cystectomy with lysis of adhesions   Family History Family History  Problem Relation Age of Onset   Hypertension Mother    Thyroid disease Mother    Diabetes Maternal Grandmother  Hypertension Maternal Grandmother     Social History Social History   Tobacco Use   Smoking status: Never   Smokeless tobacco: Never  Vaping Use   Vaping status: Never Used  Substance Use Topics   Alcohol use: Yes    Comment: socially   Drug use: Never   Allergies Patient has no known allergies.  Review of Systems A thorough review of systems was obtained and all systems are negative except as noted in the HPI and PMH.   Physical Exam Vital Signs  I have reviewed the triage vital signs BP 132/85   Pulse 70   Temp 98.5 F (36.9 C) (Oral)   Resp 13   LMP 09/20/2023 (Approximate)   SpO2 100%  Physical Exam Vitals and nursing note reviewed.  Constitutional:      General: She is not in acute distress.    Appearance: Normal appearance.  HENT:     Head: Normocephalic and atraumatic.     Right Ear: External ear normal.     Left Ear: External ear normal.     Nose: Nose normal.     Mouth/Throat:     Mouth: Mucous membranes are moist.  Eyes:     General: No scleral icterus.       Right eye: No discharge.         Left eye: No discharge.  Cardiovascular:     Rate and Rhythm: Normal rate and regular rhythm.     Pulses: Normal pulses.     Heart sounds: Normal heart sounds.  Pulmonary:     Effort: Pulmonary effort is normal. No respiratory distress.     Breath sounds: Normal breath sounds. No stridor.  Chest:    Abdominal:     General: Abdomen is flat. There is no distension.     Palpations: Abdomen is soft.     Tenderness: There is no abdominal tenderness.  Musculoskeletal:     Cervical back: No rigidity.     Right lower leg: No edema.     Left lower leg: No edema.  Skin:    General: Skin is warm and dry.     Capillary Refill: Capillary refill takes less than 2 seconds.  Neurological:     Mental Status: She is alert.  Psychiatric:        Mood and Affect: Mood normal.        Behavior: Behavior normal. Behavior is cooperative.     ED Results and Treatments Labs (all labs ordered are listed, but only abnormal results are displayed) Labs Reviewed  CBC - Abnormal; Notable for the following components:      Result Value   WBC 12.3 (*)    All other components within normal limits  BASIC METABOLIC PANEL WITH GFR  HCG, SERUM, QUALITATIVE  TROPONIN I (HIGH SENSITIVITY)  Radiology No results found.   Pertinent labs & imaging results that were available during my care of the patient were reviewed by me and considered in my medical decision making (see MDM for details).  Medications Ordered in ED Medications - No data to display                                                                                                                                   Procedures Procedures  (including critical care time)  Medical Decision Making / ED Course    Medical Decision Making:    Kristina Arias is a 28 y.o. female with without significant past medical  history who presents to the ED with complaint of chest pain. The complaint involves an extensive differential diagnosis and also carries with it a high risk of complications and morbidity.  Serious etiology was considered. Ddx includes but is not limited to: Differential includes all life-threatening causes for chest pain. This includes but is not exclusive to acute coronary syndrome, aortic dissection, pulmonary embolism, cardiac tamponade, community-acquired pneumonia, pericarditis, musculoskeletal chest wall pain, etc.   Complete initial physical exam performed, notably the patient was in no acute distress, HDS.    Reviewed and confirmed nursing documentation for past medical history, family history, social history.  Vital signs reviewed.    Chest pain with low risk for ACS> - Patient with chest pain over the past 24 hours, pain is reproducible on exam.  Pain appears to be midsternal and follows her intercostal muscles around to her back.  Work appears reassuring.  Low risk heart score - low risk Well's score, PE seems unlikely in this context  The patient's chest pain is not suggestive of pulmonary embolus, cardiac ischemia, aortic dissection, pericarditis, myocarditis, pulmonary embolism, pneumothorax, pneumonia, Zoster, or esophageal perforation, or other serious etiology.  Historically not abrupt in onset, tearing or ripping, pulses symmetric. EKG nonspecific for ischemia/infarction. No dysrhythmias, brugada, WPW, prolonged QT noted.   Troponin negative. CXR reviewed. Labs without demonstration of acute pathology unless otherwise noted above. Low HEART Score: 0-3 points (0.9-1.7% risk of MACE).  Given the extremely low risk of these diagnoses further testing and evaluation for these possibilities does not appear to be indicated at this time. Patient in no distress and overall condition improved here in the ED. Detailed discussions were had with the patient regarding current findings, and need  for close f/u with PCP or on call doctor. The patient has been instructed to return immediately if the symptoms worsen in any way for re-evaluation. Patient verbalized understanding and is in agreement with current care plan. All questions answered prior to discharge.                    Additional history obtained: -Additional history obtained from spouse -External records from outside source obtained and reviewed including: Chart review including previous notes, labs, imaging, consultation notes including  Primary  care documentation   Lab Tests: -I ordered, reviewed, and interpreted labs.   The pertinent results include:   Labs Reviewed  CBC - Abnormal; Notable for the following components:      Result Value   WBC 12.3 (*)    All other components within normal limits  BASIC METABOLIC PANEL WITH GFR  HCG, SERUM, QUALITATIVE  TROPONIN I (HIGH SENSITIVITY)    Notable for mild leukocytosis, recent viral infection, doubt sepsis  EKG   EKG Interpretation Date/Time:  Sunday October 04 2023 14:16:11 EDT Ventricular Rate:  87 PR Interval:  144 QRS Duration:  94 QT Interval:  352 QTC Calculation: 423 R Axis:   74  Text Interpretation: Normal sinus rhythm with sinus arrhythmia Normal ECG No previous ECGs available Confirmed by Elnor Savant ([id number]) on 10/04/2023 7:04:44 PM         Imaging Studies ordered: I ordered imaging studies including chest x-ray I independently visualized the following imaging with scope of interpretation limited to determining acute life threatening conditions related to emergency care; findings noted above I agree with the radiologist interpretation If any imaging was obtained with contrast I closely monitored patient for any possible adverse reaction a/w contrast administration in the emergency department   Medicines ordered and prescription drug management: Meds ordered this encounter  Medications   DISCONTD: cyclobenzaprine  (FLEXERIL )  10 MG tablet    Sig: Take 1 tablet (10 mg total) by mouth 2 (two) times daily as needed for muscle spasms.    Dispense:  20 tablet    Refill:  0   DISCONTD: ibuprofen  (ADVIL ) 600 MG tablet    Sig: Take 1 tablet (600 mg total) by mouth every 6 (six) hours as needed.    Dispense:  30 tablet    Refill:  0   DISCONTD: acetaminophen  (TYLENOL ) 325 MG tablet    Sig: Take 2 tablets (650 mg total) by mouth every 6 (six) hours as needed.    Dispense:  36 tablet    Refill:  0   cyclobenzaprine  (FLEXERIL ) 10 MG tablet    Sig: Take 1 tablet (10 mg total) by mouth 2 (two) times daily as needed for muscle spasms.    Dispense:  20 tablet    Refill:  0   ibuprofen  (ADVIL ) 600 MG tablet    Sig: Take 1 tablet (600 mg total) by mouth every 6 (six) hours as needed.    Dispense:  30 tablet    Refill:  0   acetaminophen  (TYLENOL ) 325 MG tablet    Sig: Take 2 tablets (650 mg total) by mouth every 6 (six) hours as needed.    Dispense:  36 tablet    Refill:  0    -I have reviewed the patients home medicines and have made adjustments as needed   Consultations Obtained: Not applicable  Cardiac Monitoring: The patient was maintained on a cardiac monitor.  I personally viewed and interpreted the cardiac monitored which showed an underlying rhythm of: NSR Continuous pulse oximetry interpreted by myself, 100% on RA.    Social Determinants of Health:  Diagnosis or treatment significantly limited by social determinants of health: na   Reevaluation: After the interventions noted above, I reevaluated the patient and found that they have improved  Co morbidities that complicate the patient evaluation  Past Medical History:  Diagnosis Date   Vision abnormalities    wears glasses -also has lazy(L) eye and crossed (L) eye      Dispostion: Disposition decision including  need for hospitalization was considered, and patient discharged from emergency department.    Final Clinical Impression(s) / ED  Diagnoses Final diagnoses:  Chest pain with low risk of acute coronary syndrome        Elnor Jayson LABOR, DO 10/05/23 1933    Elnor Jayson LABOR, DO 10/05/23 1934

## 2023-10-04 NOTE — ED Triage Notes (Signed)
 Pt reports CP that radiates to both shoulders and down left arm. Also having some SOB and nausea. Denies cardiac hx or family hx

## 2023-10-04 NOTE — ED Provider Triage Note (Signed)
 Emergency Medicine Provider Triage Evaluation Note  Kristina Arias , a 28 y.o. female  was evaluated in triage.  Pt complains of chest pain. Reports symptoms ongoing for the past few days. No history of similar. Pain is throughout her chest. Reports tightness as well as shortness of breath.  Denies leg pain or leg swelling, no recent travel or surgeries.  Is on OCPs. No cardiac history, she does not smoke. No aggravating or relieving factors. Reports she was nauseous a few days ago, denies nausea today.  Review of Systems  Positive:  Negative:   Physical Exam  BP (!) 152/90 (BP Location: Left Arm)   Pulse 85   Temp 98.9 F (37.2 C)   Resp 18   LMP 09/20/2023 (Approximate)   SpO2 100%  Gen:   Awake, no distress   Resp:  Normal effort  MSK:   Moves extremities without difficulty  Other:    Medical Decision Making  Medically screening exam initiated at 2:31 PM.  Appropriate orders placed.  Kristina Arias was informed that the remainder of the evaluation will be completed by another provider, this initial triage assessment does not replace that evaluation, and the importance of remaining in the ED until their evaluation is complete.     Nora Lauraine LABOR, PA-C 10/04/23 1433

## 2023-10-04 NOTE — ED Notes (Signed)
Patient ambulated to and from restroom.

## 2023-10-09 ENCOUNTER — Ambulatory Visit: Payer: Self-pay | Admitting: *Deleted

## 2023-10-09 ENCOUNTER — Other Ambulatory Visit: Payer: Self-pay

## 2023-10-09 ENCOUNTER — Emergency Department (HOSPITAL_COMMUNITY)
Admission: EM | Admit: 2023-10-09 | Discharge: 2023-10-09 | Disposition: A | Discharge status: Home / Self Care | Attending: Emergency Medicine | Admitting: Emergency Medicine

## 2023-10-09 ENCOUNTER — Emergency Department (HOSPITAL_COMMUNITY)

## 2023-10-09 ENCOUNTER — Encounter (HOSPITAL_COMMUNITY): Payer: Self-pay

## 2023-10-09 DIAGNOSIS — R519 Headache, unspecified: Secondary | ICD-10-CM | POA: Diagnosis not present

## 2023-10-09 DIAGNOSIS — F419 Anxiety disorder, unspecified: Secondary | ICD-10-CM | POA: Diagnosis not present

## 2023-10-09 DIAGNOSIS — R0789 Other chest pain: Secondary | ICD-10-CM | POA: Diagnosis present

## 2023-10-09 DIAGNOSIS — G44209 Tension-type headache, unspecified, not intractable: Secondary | ICD-10-CM

## 2023-10-09 LAB — BASIC METABOLIC PANEL WITH GFR
Anion gap: 11 (ref 5–15)
BUN: <5 mg/dL — ABNORMAL LOW (ref 6–20)
CO2: 21 mmol/L — ABNORMAL LOW (ref 22–32)
Calcium: 9.4 mg/dL (ref 8.9–10.3)
Chloride: 106 mmol/L (ref 98–111)
Creatinine, Ser: 0.81 mg/dL (ref 0.44–1.00)
GFR, Estimated: >60 mL/min (ref >60)
Glucose, Bld: 93 mg/dL (ref 70–99)
Potassium: 4.5 mmol/L (ref 3.5–5.1)
Sodium: 138 mmol/L (ref 135–145)

## 2023-10-09 LAB — HCG, SERUM, QUALITATIVE: Preg, Serum: NEGATIVE

## 2023-10-09 LAB — TROPONIN I (HIGH SENSITIVITY)
Troponin I (High Sensitivity): 3 ng/L (ref <18)
Troponin I (High Sensitivity): <2 ng/L (ref <18)

## 2023-10-09 LAB — CBC
HCT: 42.7 % (ref 36.0–46.0)
Hemoglobin: 13.6 g/dL (ref 12.0–15.0)
MCH: 29.1 pg (ref 26.0–34.0)
MCHC: 31.9 g/dL (ref 30.0–36.0)
MCV: 91.4 fL (ref 80.0–100.0)
Platelets: 287 K/uL (ref 150–400)
RBC: 4.67 MIL/uL (ref 3.87–5.11)
RDW: 12.4 % (ref 11.5–15.5)
WBC: 7.1 K/uL (ref 4.0–10.5)
nRBC: 0 % (ref 0.0–0.2)

## 2023-10-09 LAB — D-DIMER, QUANTITATIVE: D-Dimer, Quant: 0.3 ug{FEU}/mL (ref 0.00–0.50)

## 2023-10-09 MED ORDER — PROCHLORPERAZINE EDISYLATE 10 MG/2ML IJ SOLN
5.0000 mg | Freq: Once | INTRAMUSCULAR | Status: DC
  Filled 2023-10-09: qty 2

## 2023-10-09 MED ORDER — ACETAMINOPHEN 500 MG PO TABS
1000.0000 mg | ORAL_TABLET | Freq: Once | ORAL | Status: AC
  Administered 2023-10-09: 1000 mg via ORAL
  Filled 2023-10-09: qty 2

## 2023-10-09 MED ORDER — KETOROLAC TROMETHAMINE 60 MG/2ML IM SOLN
30.0000 mg | Freq: Once | INTRAMUSCULAR | Status: AC
  Administered 2023-10-09: 30 mg via INTRAMUSCULAR
  Filled 2023-10-09: qty 2

## 2023-10-09 MED ORDER — SODIUM CHLORIDE 0.9 % IV BOLUS: 1000.0000 mL | Freq: Once | INTRAVENOUS | Status: DC

## 2023-10-09 MED ORDER — SODIUM CHLORIDE 0.9 % IV BOLUS: 500.0000 mL | Freq: Once | INTRAVENOUS | Status: DC

## 2023-10-09 NOTE — Telephone Encounter (Signed)
 Agree with ER recommendation

## 2023-10-09 NOTE — ED Triage Notes (Addendum)
 Pt reports she was having generalized CP tat radiates into her Lt arm with some SOB that she was seen for 4 days ago & then it all subsided. Pt states that it returned while she was at work today plus a HA that made her feel like was going to pass out, no LOC. A/Ox4, rates her pain 6/10 during triage, states til last night the muscle relaxer & Tylenol  had been helping.

## 2023-10-09 NOTE — Discharge Instructions (Signed)
 ### Chest Pain and Headache Info     **What Was Found During Your Visit**      You came to the emergency department with chest pain, a headache that felt like a tight band around your head, and pain in your left arm, upper shoulders, and neck. The doctor also found that pressing on your chest made the pain worse (called reproducible chest wall tenderness).      **Tests and Results**      Because chest pain can sometimes be a sign of a serious heart or lung problem, several tests were done:      - **Heart tests:** Your heart was checked with an electrocardiogram (ECG) and blood tests for troponin (a marker for heart damage). Both were normal.      - **Chest X-ray:** This looked for problems in your lungs and heart. It was normal.      - **Blood test for clots (D-dimer):** This was done because your heart rate was high and you use oral contraceptive pills, which can increase the risk of blood clots. The test was negative, meaning no sign of a clot.      - **Risk scores:** Special scoring systems ([place], PERC, HEART) were used to check your risk for heart attack or blood clots. All results showed you were at low risk.      **What Does This Mean?**      All the tests ruled out serious causes like heart attack and blood clots in the lungs. According to guidelines from the [employer] of Cardiology and American Heart Association, most people with chest pain and normal tests do not have life-threatening conditions.[1][2][3][4] When chest pain can be made worse by pressing on the chest, it is often due to muscle or joint strain (musculoskeletal pain).[1][5][4] The headache you described is typical of a tension-type headache, which is common and usually not dangerous.      **What Is the Likely Cause?**      - **Musculoskeletal pain:** This is pain from the muscles, bones, or joints in your chest wall. It can happen from strain, poor posture, or minor injury.      - **Tension headache:**  This type of headache feels like a tight band around your head and is often related to stress or muscle tension in the neck and shoulders.      **What Should You Do Next?**      - **Rest and gentle movement:** Avoid activities that make the pain worse. Gentle stretching may help.      - **Pain relief:** Over-the-counter pain medicines like acetaminophen  or ibuprofen  can help, but use them as directed.      - **Heat or ice:** Applying a warm pack or ice to sore muscles may help.      - **Stress management:** Relaxation techniques, good sleep, and regular exercise can help prevent tension headaches.      **When to Seek Help Again**      Return to the emergency department or call your doctor if you have:      - Chest pain that is severe, crushing, or comes with sweating, nausea, or trouble breathing      - Fainting or feeling very weak      - Sudden trouble speaking, seeing, or moving      **Summary**      Your tests show no signs of heart attack or blood clots. The most likely cause of your symptoms is muscle or joint pain in your chest  and a tension headache. These are common and usually improve with simple treatments. If your symptoms change or get worse, seek medical attention.      ### References  1. 2021 AHA/ACC/ASE/CHEST/SAEM/SCCT/SCMR Guideline for the Evaluation and Diagnosis of Chest Pain: A Report of the American College of Cardiology/American Heart Association Joint Committee on Clinical Practice Guidelines. Sherman CHRISTELLA Lipps PD, Marinus BIRCH, et al. Journal of the [facility] of Cardiology. 2021;78(22):e187-e285. doi:10.1016/j.jacc.2021.07.053. 2. Navigating a Complicated World: The American Heart Association/American College of Cardiology/American College of Chest Physicians/Society of Academic Emergency Medicine/Society of Cardiovascular Computed Tomography/Society of Cardiovascular Magnetic Resonance Chest Pain Guidelines. [address] Gentile F, Jumean M, et al. Chest.  7977;837(8):z8-z6. doi:10.1016/j.chest.2022.01.058. 3. 2022 ACC Expert Consensus Decision Pathway on the Evaluation and Disposition of Acute Chest Pain In the Emergency Department: A Report of the American College of Cardiology Solution Set Oversight Committee. Kontos MC, de Ether SHILLING, Deitelzweig SB, et al. Journal of the [employer] of Cardiology. 2022;80(20):1925-1960. doi:10.1016/j.jacc.2022.08.750. 4. Non-Cardiac Chest Pain Patients in the Emergency Department: Do Physicians Have a Plan How to Diagnose and Treat Them? A Retrospective Study. Wertli MM, Dangma TD, Mller SE, et al. PloS One. 2019;14(2):e0211615. doi:10.1371/journal.pone.9788384. 5. Imaging in the Evaluation of Chest Pain in the Primary Care Setting, Part 2: Sources of Noncardiac Chest Pain. Baldemar AS, Rubinowitz AN, Gange CP, Bader EM, Cortopassi IO. The American Journal of Medicine. 2020;133(10):1135-1142. doi:10.1016/j.amjmed.2020.04.013.

## 2023-10-09 NOTE — ED Provider Notes (Signed)
 [place] [facility]MENT AT [facility] Provider Note   CSN: [id number] Arrival date & time: 10/09/23  1503     Patient presents with: Chest Pain   Kristina Arias is a 28 y.o. female who is seen now in her second visit for chest pain.  She describes central chest pressure, sharp intermittent pain that radiates down her left arm and sometimes her right arm.  Today she has an associated headache which she describes in a hatband distribution down the back of her neck into her upper or upper shoulders.  She does take oral contraceptive pills.  She denies unilateral leg swelling or pain, recent traumas, history of PE or DVT.  She denies exertional dyspnea or chest pain.  Patient is extremely anxious and tearful.  Patient denies any vision change, unilateral weakness, difficulty swallowing.  She describes her headache as sharp but not throbbing.  She has no history of migraine headaches.    Chest Pain      Prior to Admission medications   Medication Sig Start Date End Date Taking? Authorizing Provider  acetaminophen  (TYLENOL ) 325 MG tablet Take 2 tablets (650 mg total) by mouth every 6 (six) hours as needed. 10/04/23   Elnor Jayson LABOR, DO  cetirizine (ZYRTEC) 10 MG chewable tablet Chew 10 mg by mouth daily.    [provider]  cyclobenzaprine  (FLEXERIL ) 10 MG tablet Take 1 tablet (10 mg total) by mouth 2 (two) times daily as needed for muscle spasms. 10/04/23   Elnor Jayson LABOR, DO  ibuprofen  (ADVIL ) 600 MG tablet Take 1 tablet (600 mg total) by mouth every 6 (six) hours as needed. 10/04/23   Elnor Jayson LABOR, DO  Multiple Vitamin (MULTIVITAMIN) tablet Take 1 tablet by mouth daily.    [provider]  norethindrone-ethinyl estradiol-FE (JUNEL FE 1/20) 1-20 MG-MCG tablet Take 1 tablet by mouth daily. 12/16/21   [provider]    Allergies: Patient has no known allergies.    Review of Systems  Cardiovascular:  Positive for chest pain.    Updated  Vital Signs BP (!) 135/90   Pulse (!) 106   Temp 97.7 F (36.5 C)   Resp 18   Ht 5' 6 (1.676 m)   Wt 81.6 kg   LMP 09/20/2023 (Approximate)   SpO2 100%   BMI 29.05 kg/m   Physical Exam Vitals and nursing note reviewed.  Constitutional:      General: She is not in acute distress.    Appearance: She is well-developed. She is not diaphoretic.  HENT:     Head: Normocephalic and atraumatic.     Right Ear: External ear normal.     Left Ear: External ear normal.     Nose: Nose normal.     Mouth/Throat:     Mouth: Mucous membranes are moist.  Eyes:     General: No scleral icterus.    Conjunctiva/sclera: Conjunctivae normal.     Pupils: Pupils are equal, round, and reactive to light.     Comments: No horizontal, vertical or rotational nystagmus  Neck:     Comments: Full active and passive ROM without pain No midline or paraspinal tenderness No nuchal rigidity or meningeal signs Cardiovascular:     Rate and Rhythm: Normal rate and regular rhythm.     Heart sounds: Normal heart sounds. No murmur heard.    No friction rub. No gallop.  Pulmonary:     Effort: Pulmonary effort is normal. No respiratory distress.  Breath sounds: Normal breath sounds. No wheezing or rales.  Chest:     Chest wall: Tenderness present.    Abdominal:     General: Bowel sounds are normal. There is no distension.     Palpations: Abdomen is soft. There is no mass.     Tenderness: There is no abdominal tenderness. There is no guarding or rebound.  Musculoskeletal:        General: Normal range of motion.     Cervical back: Normal range of motion and neck supple.  Lymphadenopathy:     Cervical: No cervical adenopathy.  Skin:    General: Skin is warm and dry.     Findings: No rash.  Neurological:     Mental Status: She is alert and oriented to person, place, and time.     Cranial Nerves: No cranial nerve deficit.     Motor: No abnormal muscle tone.     Coordination: Coordination normal.      Comments: Speech is clear and goal oriented, follows commands Major Cranial nerves without deficit, no facial droop Normal strength in upper and lower extremities bilaterally including dorsiflexion and plantar flexion, strong and equal grip strength Sensation normal to light and sharp touch Moves extremities without ataxia, coordination intact Normal finger to nose and rapid alternating movements Neg romberg, no pronator drift Normal gait Normal heel-shin and balance   Psychiatric:        Behavior: Behavior normal.        Thought Content: Thought content normal.        Judgment: Judgment normal.     (all labs ordered are listed, but only abnormal results are displayed) Labs Reviewed  BASIC METABOLIC PANEL WITH GFR - Abnormal; Notable for the following components:      Result Value   CO2 21 (*)    BUN <5 (*)    All other components within normal limits  CBC  HCG, SERUM, QUALITATIVE  D-DIMER, QUANTITATIVE  TROPONIN I (HIGH SENSITIVITY)  TROPONIN I (HIGH SENSITIVITY)    EKG: None  Radiology: DG Chest 2 View Result Date: 10/09/2023 CLINICAL DATA:  Chest pain EXAM: CHEST - 2 VIEW COMPARISON:  Chest x-ray 10/04/2023 FINDINGS: The heart size and mediastinal contours are within normal limits. Both lungs are clear. The visualized skeletal structures are unremarkable. IMPRESSION: No active cardiopulmonary disease. Electronically Signed   By: Greig Pique M.D.   On: 10/09/2023 16:27     Procedures   Medications Ordered in the ED  sodium chloride  0.9 % bolus 1,000 mL (has no administration in time range)  prochlorperazine  (COMPAZINE ) injection 5 mg (has no administration in time range)  sodium chloride  0.9 % bolus 500 mL (has no administration in time range)  ketorolac  (TORADOL ) injection 30 mg (has no administration in time range)  acetaminophen  (TYLENOL ) tablet 1,000 mg (1,000 mg Oral Given 10/09/23 2020)    Clinical Course as of 10/09/23 2047  Fri Oct 09, 2023  1954  Troponin I (High Sensitivity) [AH]  1954 D-dimer, quantitative [AH]  1954 hCG, serum, qualitative [AH]  2020 EKG, rate 75, sinus rhythm, no ST elevations or depression, normal intervals, sinus arrhythmia. [LS]  2042 Basic metabolic panel(!) [AH]  2042 CBC [AH]  2042 Troponin I (High Sensitivity) Troponin negative x 2 [AH]  2042 hCG, serum, qualitative [AH]  2042 D-dimer, quantitative Negative d-dimer  [AH]  2042 DG Chest 2 View I personally visualized and interpreted the images using our PACS system. Acute findings include:  No acute findings  [  AH]  2043 ECG interpretation    Rate: 75  Rhythm: normal sinus rhythm  QRS Axis: normal  Intervals: normal  ST/T Wave abnormalities: normal  Conduction Disutrbances: none  Narrative Interpretation:   Old EKG Reviewed: No significant changes noted    [AH]    Clinical Course User Index [AH] Arloa Chroman, PA-C [LS] Rogelia Jerilynn RAMAN, MD             HEART Score: 0                Geneva (Revised) Score: 5, Geneva Score Interpretation: Moderate Risk Group: ~20-30% incidence of pulmonary embolism from several studies PERC Score: 2, PERC Score Interpretation: If any criteria are positive, the PERC rule cannot be used to rule out PE in this patient Medical Decision Making Amount and/or Complexity of Data Reviewed Labs: ordered. Decision-making details documented in ED Course. Radiology: ordered. Decision-making details documented in ED Course. ECG/medicine tests: ordered.  Risk OTC drugs. Prescription drug management.     Given the large differential diagnosis for Haywood JAYSON Essex, the decision making in this case is of high complexity.  After evaluating all of the data points in this case, the presentation of ANTONIO WOODHAMS is NOT consistent with Acute Coronary Syndrome (ACS) and/or myocardial ischemia, pulmonary embolism, aortic dissection; Borhaave's, significant arrythmia, pneumothorax, cardiac tamponade, or other  emergent cardiopulmonary condition.  Further, the presentation of OREAN GIARRATANO is NOT consistent with pericarditis, myocarditis, cholecystitis, pancreatitis, mediastinitis, endocarditis, new valvular disease.  Additionally, the presentation of Fabiana C Robinsonis NOT consistent with flail chest, cardiac contusion, ARDS, or significant intra-thoracic or intra-abdominal bleeding.  Moreover, this presentation is NOT consistent with pneumonia, sepsis, or pyelonephritis.  The patient has a HEART Score: 0  Headache treated and somewhat improved with Medications Ordered in the ED  sodium chloride  0.9 % bolus 1,000 mL (has no administration in time range)  prochlorperazine  (COMPAZINE ) injection 5 mg (has no administration in time range)  sodium chloride  0.9 % bolus 500 mL (has no administration in time range)  ketorolac  (TORADOL ) injection 30 mg (has no administration in time range)  acetaminophen  (TYLENOL ) tablet 1,000 mg (1,000 mg Oral Given 10/09/23 2020)      Strict return and follow-up precautions have been given by me personally or by detailed written instruction given verbally by nursing staff using the teach back method to the patient/family/caregiver(s).  Data Reviewed/Counseling: I have reviewed the patient's vital signs, nursing notes, and other relevant tests/information. I had a detailed discussion regarding the historical points, exam findings, and any diagnostic results supporting the discharge diagnosis. I also discussed the need for outpatient follow-up and the need to return to the ED if symptoms worsen or if there are any questions or concerns that arise at home.      Final diagnoses:  None    ED Discharge Orders     None          Arloa Chroman, PA-C 10/09/23 2056    Rogelia Jerilynn RAMAN, MD 10/13/23 JEROLYN

## 2023-10-09 NOTE — Telephone Encounter (Signed)
 FYI Only or Action Required?: Action required by provider: request for appointment.  Patient was last seen in primary care on 05/19/2022 by Corwin Antu, FNP.  Called Nurse Triage reporting Chest Pain.  Symptoms began several weeks ago.  Interventions attempted: UC 09/29/23, ED 9/14 without dignosis  Symptoms are: unchanged.  Triage Disposition: Go to ED Now (Notify PCP)  Patient/caregiver understands and will follow disposition?: Patient concerned symptoms are not better- due to symptoms profile- ED advaised. Patient wants to see PCP   Reason for Disposition  [1] Chest pain (or angina) comes and goes AND [2] is happening more often (increasing in frequency) or getting worse (increasing in severity)  (Exception: Chest pains that last only a few seconds.)  Answer Assessment - Initial Assessment Questions 1. LOCATION: Where does it hurt?       Tightness in chest and left arm 2. RADIATION: Does the pain go anywhere else? (e.g., into neck, jaw, arms, back)     Feels sensation down the arm 3. ONSET: When did the chest pain begin? (Minutes, hours or days)      2 days ago- got worse again, started Sat/Sun 4. PATTERN: Does the pain come and go, or has it been constant since it started?  Does it get worse with exertion?      Comes and goes, start feeling weak with movement- worse with laying down 5. DURATION: How long does it last (e.g., seconds, minutes, hours)       Can last-minutes 6. SEVERITY: How bad is the pain?  (e.g., Scale 1-10; mild, moderate, or severe)     3-4/10 7. CARDIAC RISK FACTORS: Do you have any history of heart problems or risk factors for heart disease? (e.g., angina, prior heart attack; diabetes, high blood pressure, high cholesterol, smoker, or strong family history of heart disease)     no 8. PULMONARY RISK FACTORS: Do you have any history of lung disease?  (e.g., blood clots in lung, asthma, emphysema, birth control pills)     OCP 9. CAUSE:  What do you think is causing the chest pain?     unsure 10. OTHER SYMPTOMS: Do you have any other symptoms? (e.g., dizziness, nausea, vomiting, sweating, fever, difficulty breathing, cough)       Headache, breathing- feels tight  Protocols used: Chest Pain-A-AH   Copied from CRM #[id number]. Topic: Clinical - Red Word Triage >> Oct 09, 2023  8:50 AM Revonda D wrote: Red Word that prompted transfer to Nurse Triage: chest pains, SOB, headache  Pt stated that she is experiencing chest pains/tightness, SOB, headaches, and feeling weak. Pt would like to schedule an appt with provider.     ----------------------------------------------------------------------- From previous Reason for Contact - Scheduling: Patient/patient representative is calling to schedule an appointment. Refer to attachments for appointment information.

## 2023-10-09 NOTE — ED Triage Notes (Signed)
 C/o chest pain and sob states she was seen MOnday for same pain went away for 2 days and while at work today the pain returned.

## 2023-10-12 NOTE — Telephone Encounter (Signed)
 NOTED

## 2023-10-12 NOTE — Telephone Encounter (Signed)
 I have been out of office but per chart review tab pt went to [facility] ED on 10/09/23 and has FU with ONEIDA Patrick FNP on 10/13/23 at 12:40. Sending note to ONEIDA Patrick FNP.

## 2023-10-13 ENCOUNTER — Inpatient Hospital Stay: Admitting: Family

## 2023-10-14 ENCOUNTER — Ambulatory Visit: Admitting: Family

## 2023-10-14 VITALS — BP 124/60 | HR 93 | Temp 98.6°F | Ht 66.0 in | Wt 183.0 lb

## 2023-10-14 DIAGNOSIS — Z23 Encounter for immunization: Secondary | ICD-10-CM | POA: Diagnosis not present

## 2023-10-14 DIAGNOSIS — R310 Gross hematuria: Secondary | ICD-10-CM | POA: Diagnosis not present

## 2023-10-14 DIAGNOSIS — L700 Acne vulgaris: Secondary | ICD-10-CM | POA: Diagnosis not present

## 2023-10-14 DIAGNOSIS — N83201 Unspecified ovarian cyst, right side: Secondary | ICD-10-CM

## 2023-10-14 LAB — POC URINALSYSI DIPSTICK (AUTOMATED)
Bilirubin, UA: NEGATIVE
Glucose, UA: NEGATIVE
Ketones, UA: 0.5
Leukocytes, UA: NEGATIVE
Nitrite, UA: NEGATIVE
Protein, UA: NEGATIVE
Spec Grav, UA: 1.01 (ref 1.010–1.025)
Urobilinogen, UA: 4 U/dL — AB
pH, UA: 8.5 — AB (ref 5.0–8.0)

## 2023-10-14 NOTE — Addendum Note (Signed)
 Addended by: SEBASTIAN DANNA GRADE on: 10/14/2023 03:10 PM   Modules accepted: Orders

## 2023-10-14 NOTE — Progress Notes (Signed)
 Established Patient Office Visit  Subjective:      CC:  Chief Complaint  Patient presents with   Hospitalization Follow-up    ED visit from 9/19    HPI: Kristina Arias is a 28 y.o. female presenting on 10/14/2023 for Hospitalization Follow-up (ED visit from 9/19) .  Discussed the use of AI scribe software for clinical note transcription with the patient, who gave verbal consent to proceed.  History of Present Illness Kristina Arias is a 28 year old female who presents with chest pain.  She has been experiencing left-sided chest pain since September 14th, described as sternal, aching, and sharp, radiating to the left shoulder and wrapping around to the back. She visited the emergency room twice, on September 14th and September 19th, due to these symptoms. Diagnostic tests including EKGs were performed, and troponin tests were negative. A chest X-ray was performed, and her white blood cell count was initially elevated.  She had been sick the week prior to the onset of chest pain, with symptoms of laryngitis, including voice loss for three days. She was treated with prednisone and a Z-Pak. She also took some cold medicine around the time of her ER visits.  On September 19th, she experienced associated symptoms of headache, back pain, and sharp intermittent chest pain radiating down her left arm. She felt anxious and tearful during these episodes. A D-dimer test was negative.  She reports occasional palpitations, particularly around her menstrual cycle, described as feeling like her heart beats a little hard, but not fast. She does not associate them with caffeine intake.  She mentions a history of ovarian cysts, with occasional side pain and a recent episode of urine discoloration, described as red. The pain was dull and throbbing, without increased frequency, urgency, or burning during urination. She has not been diagnosed with PCOS but suspects it due to her history of cysts and  irregular periods prior to birth control use.  No upper right quadrant abdominal pain, increased frequency or urgency of urination, burning during urination, vaginal discharge, or changes in bowel movements.         Social history:  Relevant past medical, surgical, family and social history reviewed and updated as indicated. Interim medical history since our last visit reviewed.  Allergies and medications reviewed and updated.  DATA REVIEWED: CHART IN EPIC     ROS: Negative unless specifically indicated above in HPI.    Current Outpatient Medications:    acetaminophen  (TYLENOL ) 325 MG tablet, Take 2 tablets (650 mg total) by mouth every 6 (six) hours as needed., Disp: 36 tablet, Rfl: 0   cetirizine (ZYRTEC) 10 MG chewable tablet, Chew 10 mg by mouth daily., Disp: , Rfl:    cyclobenzaprine  (FLEXERIL ) 10 MG tablet, Take 1 tablet (10 mg total) by mouth 2 (two) times daily as needed for muscle spasms., Disp: 20 tablet, Rfl: 0   ibuprofen  (ADVIL ) 600 MG tablet, Take 1 tablet (600 mg total) by mouth every 6 (six) hours as needed., Disp: 30 tablet, Rfl: 0   Multiple Vitamin (MULTIVITAMIN) tablet, Take 1 tablet by mouth daily., Disp: , Rfl:    norethindrone-ethinyl estradiol-FE (LOESTRIN FE) 1-20 MG-MCG tablet, Take 1 tablet by mouth daily., Disp: , Rfl:    spironolactone (ALDACTONE) 50 MG tablet, Take 50 mg by mouth daily., Disp: , Rfl:         Objective:        BP 124/60   Pulse 93   Temp 98.6 F (37  C) (Temporal)   Ht 5' 6 (1.676 m)   Wt 183 lb (83 kg)   LMP 09/20/2023 (Approximate)   SpO2 99%   BMI 29.54 kg/m   Physical Exam   Wt Readings from Last 3 Encounters:  10/14/23 183 lb (83 kg)  10/09/23 180 lb (81.6 kg)  05/19/22 181 lb 6.4 oz (82.3 kg)    Physical Exam Constitutional:      General: She is not in acute distress.    Appearance: Normal appearance. She is normal weight. She is not ill-appearing, toxic-appearing or diaphoretic.  HENT:     Head:  Normocephalic.  Cardiovascular:     Rate and Rhythm: Normal rate and regular rhythm.  Pulmonary:     Effort: Pulmonary effort is normal.     Breath sounds: Normal breath sounds.  Musculoskeletal:        General: Normal range of motion.  Neurological:     General: No focal deficit present.     Mental Status: She is alert and oriented to person, place, and time. Mental status is at baseline.  Psychiatric:        Mood and Affect: Mood normal.        Behavior: Behavior normal.        Thought Content: Thought content normal.        Judgment: Judgment normal.          Results LABS WBC: 12.3 (10/04/2023) Troponin: Negative (10/04/2023) D-dimer: Negative (10/09/2023) Troponin: Negative (10/09/2023) WBC: Normalized (10/09/2023)  RADIOLOGY Chest X-ray: Reviewed (10/04/2023)  DIAGNOSTIC EKG: Normal sinus rhythm with sinus arrhythmia (10/04/2023)  Assessment & Plan:   Assessment and Plan Assessment & Plan Musculoskeletal chest pain Intermittent left-sided chest pain radiating to the left shoulder and back, described as aching and sharp. Recent viral illness with elevated white blood cells. EKG and troponin tests negative, indicating no cardiac ischemia or infarction. Pain likely musculoskeletal, possibly exacerbated by anxiety and recent illness.  Sinus arrhythmia Sinus arrhythmia on EKG, characterized by a harmless irregular rhythm varying with respiration. Normal sinus rhythm with sinus arrhythmia indicates a healthy heart. No significant abnormalities in PR intervals or QRS complexes. Occasional palpitations, possibly related to menstrual cycle. - Monitor for increased palpitations or other symptoms. Consider heart rate monitor if symptoms worsen. - Advise monitoring caffeine intake, hydration, and anxiety levels as potential triggers.  Ovarian cysts (possible polycystic ovary syndrome) Ovarian cysts with occasional pain. Possible polycystic ovary syndrome (PCOS) suggested by  presence of follicular cysts. No formal diagnosis of PCOS, but symptoms and history are suggestive. Birth control has helped regulate periods. - Consider hormonal testing for PCOS if desired in the future.  Urinary discoloration, resolved Recent episode of urinary discoloration with associated dull pain, but no increased frequency, urgency, or dysuria. Differential includes possible urinary tract infection or kidney-related issue, but no current symptoms. - Leave urine sample for analysis to rule out urinary tract infection.  Recording duration: 13 minutes      No follow-ups on file.     [name], MSN, APRN, FNP-C [facility]

## 2023-10-14 NOTE — Addendum Note (Signed)
 Addended by: SEBASTIAN DANNA GRADE on: 10/14/2023 03:28 PM   Modules accepted: Orders

## 2023-10-15 LAB — URINALYSIS, MICROSCOPIC ONLY

## 2023-10-18 ENCOUNTER — Ambulatory Visit: Payer: Self-pay | Admitting: Family

## 2023-10-18 DIAGNOSIS — R311 Benign essential microscopic hematuria: Secondary | ICD-10-CM

## 2023-11-11 ENCOUNTER — Other Ambulatory Visit

## 2023-11-11 DIAGNOSIS — R311 Benign essential microscopic hematuria: Secondary | ICD-10-CM

## 2023-11-12 LAB — URINALYSIS, ROUTINE W REFLEX MICROSCOPIC
Bilirubin Urine: NEGATIVE
Ketones, ur: NEGATIVE
Leukocytes,Ua: NEGATIVE
Nitrite: NEGATIVE
Specific Gravity, Urine: <=1.005 — AB (ref 1.000–1.030)
Total Protein, Urine: NEGATIVE
Urine Glucose: NEGATIVE
Urobilinogen, UA: 0.2 (ref 0.0–1.0)
pH: 6.5 (ref 5.0–8.0)

## 2023-11-12 LAB — URINE CULTURE
MICRO NUMBER:: 17133406
Result:: NO GROWTH
SPECIMEN QUALITY:: ADEQUATE

## 2023-11-13 ENCOUNTER — Ambulatory Visit: Payer: Self-pay | Admitting: Family

## 2023-12-14 ENCOUNTER — Ambulatory Visit: Attending: Family

## 2023-12-14 ENCOUNTER — Encounter: Payer: Self-pay | Admitting: Family

## 2023-12-14 ENCOUNTER — Ambulatory Visit: Admitting: Family

## 2023-12-14 VITALS — BP 130/82 | HR 105 | Temp 98.0°F | Ht 66.0 in | Wt 179.0 lb

## 2023-12-14 DIAGNOSIS — D72829 Elevated white blood cell count, unspecified: Secondary | ICD-10-CM | POA: Diagnosis not present

## 2023-12-14 DIAGNOSIS — R200 Anesthesia of skin: Secondary | ICD-10-CM

## 2023-12-14 DIAGNOSIS — R252 Cramp and spasm: Secondary | ICD-10-CM | POA: Diagnosis not present

## 2023-12-14 DIAGNOSIS — R519 Headache, unspecified: Secondary | ICD-10-CM

## 2023-12-14 DIAGNOSIS — R202 Paresthesia of skin: Secondary | ICD-10-CM | POA: Diagnosis not present

## 2023-12-14 DIAGNOSIS — K219 Gastro-esophageal reflux disease without esophagitis: Secondary | ICD-10-CM

## 2023-12-14 DIAGNOSIS — R002 Palpitations: Secondary | ICD-10-CM

## 2023-12-14 DIAGNOSIS — G44209 Tension-type headache, unspecified, not intractable: Secondary | ICD-10-CM

## 2023-12-14 DIAGNOSIS — G43501 Persistent migraine aura without cerebral infarction, not intractable, with status migrainosus: Secondary | ICD-10-CM

## 2023-12-14 LAB — CBC WITH DIFFERENTIAL/PLATELET
Basophils Absolute: 0 K/uL (ref 0.0–0.1)
Basophils Relative: 0.8 % (ref 0.0–3.0)
Eosinophils Absolute: 0.2 K/uL (ref 0.0–0.7)
Eosinophils Relative: 3.8 % (ref 0.0–5.0)
HCT: 41.1 % (ref 36.0–46.0)
Hemoglobin: 13.7 g/dL (ref 12.0–15.0)
Lymphocytes Relative: 26.5 % (ref 12.0–46.0)
Lymphs Abs: 1.4 K/uL (ref 0.7–4.0)
MCHC: 33.4 g/dL (ref 30.0–36.0)
MCV: 89.7 fl (ref 78.0–100.0)
Monocytes Absolute: 0.4 K/uL (ref 0.1–1.0)
Monocytes Relative: 7.8 % (ref 3.0–12.0)
Neutro Abs: 3.3 K/uL (ref 1.4–7.7)
Neutrophils Relative %: 61.1 % (ref 43.0–77.0)
Platelets: 255 K/uL (ref 150.0–400.0)
RBC: 4.58 Mil/uL (ref 3.87–5.11)
RDW: 13 % (ref 11.5–15.5)
WBC: 5.3 K/uL (ref 4.0–10.5)

## 2023-12-14 LAB — BASIC METABOLIC PANEL WITH GFR
BUN: 10 mg/dL (ref 6–23)
CO2: 26 meq/L (ref 19–32)
Calcium: 9.4 mg/dL (ref 8.4–10.5)
Chloride: 105 meq/L (ref 96–112)
Creatinine, Ser: 0.72 mg/dL (ref 0.40–1.20)
GFR: 115.72 mL/min (ref >60.00)
Glucose, Bld: 87 mg/dL (ref 70–99)
Potassium: 4.1 meq/L (ref 3.5–5.1)
Sodium: 139 meq/L (ref 135–145)

## 2023-12-14 LAB — MAGNESIUM: Magnesium: 2 mg/dL (ref 1.5–2.5)

## 2023-12-14 LAB — C-REACTIVE PROTEIN: CRP: <0.5 mg/dL (ref 0.5–20.0)

## 2023-12-14 LAB — TSH: TSH: 0.83 u[IU]/mL (ref 0.35–5.50)

## 2023-12-14 LAB — SEDIMENTATION RATE: Sed Rate: 8 mm/h (ref 0–20)

## 2023-12-14 LAB — VITAMIN B12: Vitamin B-12: 145 pg/mL — ABNORMAL LOW (ref 211–911)

## 2023-12-14 MED ORDER — CYCLOBENZAPRINE HCL 5 MG PO TABS: 5.0000 mg | ORAL_TABLET | Freq: Three times a day (TID) | ORAL | 1 refills | Status: AC | PRN

## 2023-12-14 MED ORDER — BUTALBITAL-APAP-CAFFEINE 50-325-40 MG PO TABS: 1.0000 | ORAL_TABLET | Freq: Four times a day (QID) | ORAL | 0 refills | Status: AC | PRN

## 2023-12-14 MED ORDER — OMEPRAZOLE 20 MG PO CPDR: 20.0000 mg | DELAYED_RELEASE_CAPSULE | Freq: Every day | ORAL | 0 refills | Status: AC

## 2023-12-14 MED ORDER — IBUPROFEN 600 MG PO TABS: 600.0000 mg | ORAL_TABLET | Freq: Four times a day (QID) | ORAL | 0 refills | Status: AC | PRN

## 2023-12-14 NOTE — Progress Notes (Signed)
 Established Patient Office Visit  Subjective:      CC: No chief complaint on file.   HPI: Kristina Arias is a 28 y.o. female presenting on 12/14/2023 for No chief complaint on file. .  Discussed the use of AI scribe software for clinical note transcription with the patient, who gave verbal consent to proceed.  History of Present Illness Kristina Arias is a 28 year old female who presents with severe headaches and associated neurological symptoms.  She experienced a sharp, debilitating headache on Friday while leaving work, described as having a 'band-like' distribution with sharp pain and tingling sensations. Symptoms consistent with a migraine included dizziness, difficulty focusing eyesight, nausea, vomiting, and seeing 'little black spots' before the headache onset. The headache lasted from Friday afternoon until Saturday, with residual dizziness persisting into Monday. She experienced light and sound sensitivity. She has no history of migraines, only tension headaches.  On Saturday night, she experienced jitteriness and uncontrollable body shaking, including teeth chattering, but denies having a fever. She recalls similar episodes of jitteriness and chest pain a few weeks prior, which led to an ER visit in September for central chest pain radiating to her left arm, associated with a headache and neck pain. She recalls that during her ER visit, she underwent troponin, D-dimer, EKG, and chest X-ray testing.  She continues to experience chest pain, particularly sharp pain radiating to her left arm, and tightness in her neck, jaw, chest, and back muscles. These symptoms have affected her ability to function at school. She reports numbness and decreased grip strength in her left arm and fingers, as well as bilateral blurry vision. No ear pain or congestion.  She has experienced heartburn, particularly last weekend, with burning sensations in her throat and chest, exacerbated by deep  breathing. She has lost some weight since the summer, which she attributes to intentional efforts. During her ER visit, she received a Toradol  injection and nausea medication, which alleviated her headache. Over the weekend, she tried Excedrin Migraine and ibuprofen , which provided limited relief, and Dramamine, which helped with dizziness.  She reports muscle spasms and twitching, particularly in her neck, and difficulty controlling these movements. She has been using ibuprofen  and cyclobenzaprine  as needed for muscle relaxation.         Social history:  Relevant past medical, surgical, family and social history reviewed and updated as indicated. Interim medical history since our last visit reviewed.  Allergies and medications reviewed and updated.  DATA REVIEWED: CHART IN EPIC   Wt Readings from Last 3 Encounters:  12/14/23 179 lb (81.2 kg)  10/14/23 183 lb (83 kg)  10/09/23 180 lb (81.6 kg)      ROS: Negative unless specifically indicated above in HPI.    Current Outpatient Medications:    butalbital -acetaminophen -caffeine  (FIORICET) 50-325-40 MG tablet, Take 1 tablet by mouth every 6 (six) hours as needed for headache., Disp: 14 tablet, Rfl: 0   cetirizine (ZYRTEC) 10 MG chewable tablet, Chew 10 mg by mouth daily., Disp: , Rfl:    cyclobenzaprine  (FLEXERIL ) 5 MG tablet, Take 1 tablet (5 mg total) by mouth 3 (three) times daily as needed for muscle spasms., Disp: 30 tablet, Rfl: 1   Multiple Vitamin (MULTIVITAMIN) tablet, Take 1 tablet by mouth daily., Disp: , Rfl:    norethindrone-ethinyl estradiol-FE (LOESTRIN FE) 1-20 MG-MCG tablet, Take 1 tablet by mouth daily., Disp: , Rfl:    omeprazole  (PRILOSEC) 20 MG capsule, Take 1 capsule (20 mg total) by mouth daily.,  Disp: 30 capsule, Rfl: 0   spironolactone (ALDACTONE) 50 MG tablet, Take 50 mg by mouth daily., Disp: , Rfl:    ibuprofen  (ADVIL ) 600 MG tablet, Take 1 tablet (600 mg total) by mouth every 6 (six) hours as needed.,  Disp: 30 tablet, Rfl: 0        Objective:        BP 130/82 (BP Location: Right Arm, Patient Position: Sitting, Cuff Size: Normal)   Pulse (!) 105   Temp 98 F (36.7 C) (Temporal)   Ht 5' 6 (1.676 m)   Wt 179 lb (81.2 kg)   SpO2 99%   BMI 28.89 kg/m   Physical Exam CARDIOVASCULAR: No murmur  Wt Readings from Last 3 Encounters:  12/14/23 179 lb (81.2 kg)  10/14/23 183 lb (83 kg)  10/09/23 180 lb (81.6 kg)    Physical Exam Vitals reviewed.  Constitutional:      General: She is not in acute distress.    Appearance: Normal appearance. She is normal weight. She is not ill-appearing, toxic-appearing or diaphoretic.  HENT:     Head: Normocephalic.  Eyes:     General: Lids are normal. Vision grossly intact. Gaze aligned appropriately. No visual field deficit.    Extraocular Movements: Extraocular movements intact.  Cardiovascular:     Rate and Rhythm: Normal rate.  Pulmonary:     Effort: Pulmonary effort is normal.  Musculoskeletal:        General: Normal range of motion.  Neurological:     General: No focal deficit present.     Mental Status: She is alert and oriented to person, place, and time. Mental status is at baseline.     Cranial Nerves: Cranial nerves 2-12 are intact. No cranial nerve deficit or facial asymmetry.     Sensory: Sensation is intact.     Motor: Motor function is intact.     Coordination: Coordination is intact. Heel to [facility] Test normal. Rapid alternating movements normal.     Gait: Gait is intact.  Psychiatric:        Mood and Affect: Mood normal.        Behavior: Behavior normal.        Thought Content: Thought content normal.        Judgment: Judgment normal.          Results LABS Troponin: negative (10/09/2023) D-dimer: negative (10/09/2023) CBC: leukocytosis (10/09/2023)  RADIOLOGY Chest x-ray: no acute findings (10/09/2023)  DIAGNOSTIC EKG: rate 75, sinus rhythm, no ST elevations or depression (10/09/2023)  Assessment &  Plan:   Assessment and Plan Assessment & Plan Migraine with aura and status migrainosus and tension-type headache Recent onset of severe headache with aura, dizziness, and nausea, consistent with migraine with aura. Symptoms began on Friday and persisted into the weekend. Previous tension-type headaches, but this episode is more severe and debilitating. Differential includes tension-type headache and migraine with aura. Previous ER visit showed negative troponin and D-dimer, and normal EKG, ruling out acute cardiac events. MRI ordered to rule out other causes due to thunderclap headache presentation. - Ordered MRI of the brain to rule out other causes of headache. - Prescribed Fioricet for acute migraine attacks. Pdmp reviewed - Continue ibuprofen  600 mg as needed for pain. - Advised use of heat and neck exercises for muscle tightness. - Prescribed cyclobenzaprine  for nighttime use if neck stiffness is severe.  Musculoskeletal chest pain and palpitations Intermittent chest pain with tightness and palpitations, previously evaluated in ER with negative cardiac workup.  Symptoms may be related to anxiety or musculoskeletal issues. No acute cardiac findings on previous EKG and labs. Heart monitor ordered to assess for arrhythmias and differentiate between anxiety and cardiac causes. - Ordered heart monitor for two weeks to assess for arrhythmias. - Prescribed omeprazole  for two weeks to manage potential GERD-related chest pain. -Try to decrease and or avoid spicy foods, fried fatty foods, and also caffeine  and chocolate as these can increase heartburn symptoms.   Gastroesophageal reflux disease (GERD) Recent episodes of heartburn and chest pain, possibly related to GERD. Symptoms include burning sensation in throat and chest, exacerbated by deep breathing. Omeprazole  prescribed to manage symptoms and rule out GERD as a cause of chest pain. - Prescribed omeprazole  for two weeks. Trial run - Advised  dietary modifications to avoid spicy, fried, and fatty foods.  Cramp, muscle spasm, and paresthesia Experiencing muscle spasms, cramps, and paresthesia, particularly in the neck and left arm. Symptoms may be related to anxiety or musculoskeletal issues. Previous use of muscle relaxers provided some relief. MRI ordered to rule out neurological causes. - Ordered MRI of the brain to rule out neurological causes. - Prescribed cyclobenzaprine  for nighttime use if muscle spasms are severe. - Advised use of heat and neck exercises for muscle tightness.  Sinus arrhythmia Noted on previous EKG. No acute changes or symptoms suggestive of worsening arrhythmia. Heart monitor ordered to assess for arrhythmias and differentiate between anxiety and cardiac causes. - Ordered heart monitor for two weeks to assess for arrhythmias.  Elevated white blood cell count Incidental finding of elevated white blood cell count. No current symptoms suggestive of infection or other causes. Further evaluation needed to rule out underlying issues. - Ordered repeat CBC to reassess white blood cell count. - Ordered inflammatory markers to evaluate for underlying inflammation.        Return in about 2 weeks (around 12/28/2023) for f/u headache.     [name], MSN, APRN, FNP-C [facility]

## 2023-12-15 ENCOUNTER — Ambulatory Visit
Admission: RE | Admit: 2023-12-15 | Discharge: 2023-12-15 | Disposition: A | Discharge status: Home / Self Care | Source: Ambulatory Visit | Attending: Family | Admitting: Family

## 2023-12-15 DIAGNOSIS — G43501 Persistent migraine aura without cerebral infarction, not intractable, with status migrainosus: Secondary | ICD-10-CM | POA: Insufficient documentation

## 2023-12-15 DIAGNOSIS — R202 Paresthesia of skin: Secondary | ICD-10-CM | POA: Insufficient documentation

## 2023-12-15 DIAGNOSIS — R519 Headache, unspecified: Secondary | ICD-10-CM | POA: Diagnosis present

## 2023-12-15 DIAGNOSIS — R200 Anesthesia of skin: Secondary | ICD-10-CM | POA: Insufficient documentation

## 2023-12-15 MED ORDER — GADOBUTROL 1 MMOL/ML IV SOLN
7.5000 mL | Freq: Once | INTRAVENOUS | Status: AC | PRN
  Administered 2023-12-15: 7.5 mL via INTRAVENOUS

## 2023-12-21 ENCOUNTER — Ambulatory Visit: Payer: Self-pay | Admitting: Family

## 2023-12-21 DIAGNOSIS — R200 Anesthesia of skin: Secondary | ICD-10-CM

## 2023-12-21 DIAGNOSIS — R519 Headache, unspecified: Secondary | ICD-10-CM

## 2023-12-21 DIAGNOSIS — R9089 Other abnormal findings on diagnostic imaging of central nervous system: Secondary | ICD-10-CM

## 2023-12-21 DIAGNOSIS — G43501 Persistent migraine aura without cerebral infarction, not intractable, with status migrainosus: Secondary | ICD-10-CM

## 2023-12-22 ENCOUNTER — Ambulatory Visit: Admitting: Family

## 2023-12-22 ENCOUNTER — Other Ambulatory Visit: Payer: Self-pay | Admitting: Family

## 2023-12-22 DIAGNOSIS — J0100 Acute maxillary sinusitis, unspecified: Secondary | ICD-10-CM

## 2023-12-22 MED ORDER — AMOXICILLIN-POT CLAVULANATE 875-125 MG PO TABS: 1.0000 | ORAL_TABLET | Freq: Two times a day (BID) | ORAL | 0 refills | Status: DC

## 2023-12-23 ENCOUNTER — Encounter: Payer: Self-pay | Admitting: Neurology

## 2023-12-29 ENCOUNTER — Ambulatory Visit

## 2023-12-29 DIAGNOSIS — E538 Deficiency of other specified B group vitamins: Secondary | ICD-10-CM

## 2023-12-29 MED ORDER — CYANOCOBALAMIN 1000 MCG/ML IJ SOLN
1000.0000 ug | Freq: Once | INTRAMUSCULAR | Status: AC
  Administered 2023-12-29: 1000 ug via INTRAMUSCULAR

## 2023-12-29 NOTE — Progress Notes (Signed)
 Per orders of [name], NPwho was out of office, and MARLA Gaskins NP who was in office first injection of vitamin b 12 given by Laray Arenas in right deltoid. Patient tolerated injection well. Since this was 1st vit b 12 inj pt waited 15 mins with no problems or concerns.Patient will make appointment for 1 month.

## 2024-01-04 NOTE — Telephone Encounter (Signed)
 Can we please get her back in the office?

## 2024-01-04 NOTE — Telephone Encounter (Signed)
Called and schedule pt

## 2024-01-06 ENCOUNTER — Ambulatory Visit: Payer: Self-pay

## 2024-01-06 NOTE — Telephone Encounter (Signed)
 FYI Only or Action Required?: FYI only for provider: referred to UC.  Patient was last seen in primary care on 12/14/2023 by Corwin Antu, FNP.  Called Nurse Triage reporting Hematuria.  Symptoms began 3 day ago.  Interventions attempted: Nothing.  Symptoms are: gradually worsening.  Triage Disposition: See HCP Within 4 Hours (Or PCP Triage)  Patient/caregiver understands and will follow disposition?:     Reason for Disposition  Side (flank) or lower back pain present  Answer Assessment - Initial Assessment Questions 1. SEVERITY: How bad is the pain?  (e.g., Scale 1-10; mild, moderate, or severe)     7/10 intermittent, comes in waves 2. FREQUENCY: How many times have you had painful urination today?       3. PATTERN: Is pain present every time you urinate or just sometimes?      intermittent 4. ONSET: When did the painful urination start?      3 days ago 5. FEVER: Do you have a fever? If Yes, ask: What is your temperature, how was it measured, and when did it start?     no 6. PAST UTI: Have you had a urine infection before? If Yes, ask: When was the last time? and What happened that time?       7. CAUSE: What do you think is causing the painful urination?  (e.g., UTI, scratch, Herpes sore)     uti 8. OTHER SYMPTOMS: Do you have any other symptoms? (e.g., blood in urine, flank pain, genital sores, urgency, vaginal discharge)     Back pain, blood in urine intermittently 9. PREGNANCY: Is there any chance you are pregnant? When was your last menstrual period?     no  Protocols used: Urination Pain - Female-A-AH

## 2024-01-06 NOTE — Telephone Encounter (Signed)
 Attempted to return call but NA, LM to return call. Attempt #1   Copied from CRM #[id number]. Topic: Clinical - Red Word Triage >> Jan 06, 2024 11:39 AM Thersia BROCKS wrote: Red Word that prompted transfer to Nurse Triage: buring when urinating, blood in urine, would like a urinalysis, believes she has a UTI >> Jan 06, 2024 11:55 AM Thersia BROCKS wrote: Patient would like a callback

## 2024-01-07 NOTE — Telephone Encounter (Signed)
 NOTED

## 2024-01-12 ENCOUNTER — Encounter: Payer: Self-pay | Admitting: Family

## 2024-01-12 ENCOUNTER — Ambulatory Visit: Admitting: Family

## 2024-01-12 VITALS — BP 128/84 | HR 103 | Temp 98.4°F | Ht 66.0 in | Wt 180.2 lb

## 2024-01-12 DIAGNOSIS — N200 Calculus of kidney: Secondary | ICD-10-CM

## 2024-01-12 DIAGNOSIS — R1012 Left upper quadrant pain: Secondary | ICD-10-CM

## 2024-01-12 DIAGNOSIS — G43501 Persistent migraine aura without cerebral infarction, not intractable, with status migrainosus: Secondary | ICD-10-CM

## 2024-01-12 DIAGNOSIS — Z30011 Encounter for initial prescription of contraceptive pills: Secondary | ICD-10-CM

## 2024-01-12 DIAGNOSIS — R809 Proteinuria, unspecified: Secondary | ICD-10-CM | POA: Diagnosis not present

## 2024-01-12 DIAGNOSIS — R1084 Generalized abdominal pain: Secondary | ICD-10-CM

## 2024-01-12 LAB — URINALYSIS, ROUTINE W REFLEX MICROSCOPIC
Bilirubin Urine: NEGATIVE
Ketones, ur: NEGATIVE
Leukocytes,Ua: NEGATIVE
Nitrite: NEGATIVE
Specific Gravity, Urine: 1.025 (ref 1.000–1.030)
Total Protein, Urine: NEGATIVE
Urine Glucose: NEGATIVE
Urobilinogen, UA: 0.2 (ref 0.0–1.0)
pH: 6 (ref 5.0–8.0)

## 2024-01-12 LAB — CBC WITH DIFFERENTIAL/PLATELET
Basophils Absolute: 0 K/uL (ref 0.0–0.1)
Basophils Relative: 0.9 % (ref 0.0–3.0)
Eosinophils Absolute: 0.1 K/uL (ref 0.0–0.7)
Eosinophils Relative: 1.9 % (ref 0.0–5.0)
HCT: 41.9 % (ref 36.0–46.0)
Hemoglobin: 13.9 g/dL (ref 12.0–15.0)
Lymphocytes Relative: 34.5 % (ref 12.0–46.0)
Lymphs Abs: 1.3 K/uL (ref 0.7–4.0)
MCHC: 33.2 g/dL (ref 30.0–36.0)
MCV: 89.7 fl (ref 78.0–100.0)
Monocytes Absolute: 0.4 K/uL (ref 0.1–1.0)
Monocytes Relative: 12.2 % — ABNORMAL HIGH (ref 3.0–12.0)
Neutro Abs: 1.8 K/uL (ref 1.4–7.7)
Neutrophils Relative %: 50.5 % (ref 43.0–77.0)
Platelets: 213 K/uL (ref 150.0–400.0)
RBC: 4.67 Mil/uL (ref 3.87–5.11)
RDW: 12.7 % (ref 11.5–15.5)
WBC: 3.6 K/uL — ABNORMAL LOW (ref 4.0–10.5)

## 2024-01-12 LAB — COMPREHENSIVE METABOLIC PANEL WITH GFR
ALT: 21 U/L (ref 3–35)
AST: 25 U/L (ref 5–37)
Albumin: 4.4 g/dL (ref 3.5–5.2)
Alkaline Phosphatase: 48 U/L (ref 39–117)
BUN: 6 mg/dL (ref 6–23)
CO2: 25 meq/L (ref 19–32)
Calcium: 8.8 mg/dL (ref 8.4–10.5)
Chloride: 103 meq/L (ref 96–112)
Creatinine, Ser: 0.77 mg/dL (ref 0.40–1.20)
GFR: 106.7 mL/min
Glucose, Bld: 86 mg/dL (ref 70–99)
Potassium: 4 meq/L (ref 3.5–5.1)
Sodium: 137 meq/L (ref 135–145)
Total Bilirubin: 0.2 mg/dL (ref 0.2–1.2)
Total Protein: 7 g/dL (ref 6.0–8.3)

## 2024-01-12 LAB — LIPASE: Lipase: 23 U/L (ref 11.0–59.0)

## 2024-01-12 LAB — AMYLASE: Amylase: 26 U/L — ABNORMAL LOW (ref 27–131)

## 2024-01-12 MED ORDER — NORETHINDRONE 0.35 MG PO TABS: 1.0000 | ORAL_TABLET | Freq: Every day | ORAL | 11 refills | Status: AC

## 2024-01-12 MED ORDER — TOPIRAMATE 25 MG PO TABS: 25.0000 mg | ORAL_TABLET | Freq: Two times a day (BID) | ORAL | 1 refills | Status: AC

## 2024-01-12 NOTE — Progress Notes (Signed)
 "  Established Patient Office Visit  Subjective:      CC:  Chief Complaint  Patient presents with   Medical Management of Chronic Issues    HPI: Kristina Arias is a 28 y.o. female presenting on 01/12/2024 for Medical Management of Chronic Issues .  Discussed the use of AI scribe software for clinical note transcription with the patient, who gave verbal consent to proceed.  History of Present Illness Kristina Arias is a 28 year old female who presents with persistent headaches and dizziness following a B12 injection.  She experiences chills, nausea, and dizziness immediately after receiving a B12 injection, which led her to leave work early due to severe dizziness. Muscle weakness persists but has slightly improved. She describes a constant headache unresponsive to medication, with varying sensations of pressure, sharp, and dull aches, primarily at the back and sides of her head, along with tingling in her forehead.  She was diagnosed with kidney stones after visiting urgent care due to hematuria. Imaging revealed a stone in her bladder and a 4mm stone in her right kidney. She was prescribed Flomax, Bactrim, and antiemetics. She reports improvement in flank pain and urination, with no current frequency, urgency, or dysuria. She notes a history of similar symptoms in October.  She experiences occasional palpitations and tingling in her left arm, which have improved. She is currently on oral contraceptives and has a history of ovarian cysts. She reports abdominal pain, which she attributes to either medication or her menstrual cycle. She denies current back pain, frequency, urgency, or burning with urination.  She has a history of migraines with aura, experiencing photophobia and occasional tingling. She has not had seizures and has not previously taken daily migraine prophylaxis. She is currently on Fioricet, which she has taken in the past with some relief. She also takes cetirizine  and cyclobenzaprine  as needed.  She is on winter break, which has helped reduce her anxiety. She has been using a heart monitor and has mailed it back. She continues to take oral B12 supplements without adverse effects. Her heartburn has improved with omeprazole .         Social history:  Relevant past medical, surgical, family and social history reviewed and updated as indicated. Interim medical history since our last visit reviewed.  Allergies and medications reviewed and updated.  DATA REVIEWED: CHART IN EPIC     ROS: Negative unless specifically indicated above in HPI.   Current Medications[1]        Objective:        BP 128/84 (BP Location: Left Arm, Patient Position: Sitting, Cuff Size: Normal)   Pulse (!) 103   Temp 98.4 F (36.9 C) (Temporal)   Ht 5' 6 (1.676 m)   Wt 180 lb 3.2 oz (81.7 kg)   SpO2 99%   BMI 29.09 kg/m   Physical Exam ABDOMEN: Tenderness on palpation in the right lower and left upper quadrants.  Wt Readings from Last 3 Encounters:  01/12/24 180 lb 3.2 oz (81.7 kg)  12/14/23 179 lb (81.2 kg)  10/14/23 183 lb (83 kg)    Physical Exam       Results Radiology Brain MRI (12/15/2023): Scattered small T2 hyperintensities in the cerebral white matter, mild but abnormal for age and nonspecific; mucosal thickening in the sinus.  Assessment & Plan:   Assessment and Plan Assessment & Plan Migraine with aura and status migrainosus Chronic headaches with aura, light sensitivity, and occasional tingling. MRI showed nonspecific findings. Current  treatment with Fioricet provides some relief. Concerns about birth control exacerbating symptoms. Discussed potential increased risk of stroke with estrogen-containing birth control in the context of migraines with aura. - Started topiramate  25 mg twice daily for headache prevention. - Discontinued current birth control and started progesterone-only birth control. - Continue Fioricet as needed for  acute headache relief. - Restart cetirizine for potential sinus-related symptoms. - Monitor for worsening symptoms such as limb dysfunction, vision changes, or increased numbness/tingling.  Nephrolithiasis Recent diagnosis of kidney stones with one stone in the bladder and a 4 mm stone in the right kidney. No current obstruction or significant symptoms. Previous similar episode in October without infection. - Continue tamsulosin for stone passage. - Ordered repeat urinalysis to check for proteinuria. - Ordered blood work to assess kidney function and white blood cell count. - Advised to seek emergency care if experiencing severe flank pain, fever, or urinary issues. If symptoms persist or worsen will order CT   Proteinuria No infection confirmed. Previous similar episode in October without infection. - Ordered repeat urinalysis to assess protein levels. - Ordered blood work to evaluate kidney function.  Ovarian cysts Current symptoms possibly related to ovulation. Pain localized to right lower and left upper quadrants. - Monitor for worsening pain or new symptoms. - Ordered blood work to assess for any underlying issues.  Gastroesophageal reflux disease (GERD) GERD symptoms improved with omeprazole . - Continue omeprazole  for GERD management.       Return in about 2 weeks (around 01/26/2024) for F/U HEADACHES.     [name], MSN, APRN, FNP-C [facility]        [1]  Current Outpatient Medications:    butalbital -acetaminophen -caffeine  (FIORICET) 50-325-40 MG tablet, Take 1 tablet by mouth every 6 (six) hours as needed for headache., Disp: 14 tablet, Rfl: 0   cetirizine (ZYRTEC) 10 MG chewable tablet, Chew 10 mg by mouth daily., Disp: , Rfl:    cyclobenzaprine  (FLEXERIL ) 5 MG tablet, Take 1 tablet (5 mg total) by mouth 3 (three) times daily as needed for muscle spasms., Disp: 30 tablet, Rfl: 1   ibuprofen  (ADVIL ) 600 MG tablet, Take 1 tablet  (600 mg total) by mouth every 6 (six) hours as needed., Disp: 30 tablet, Rfl: 0   Multiple Vitamin (MULTIVITAMIN) tablet, Take 1 tablet by mouth daily., Disp: , Rfl:    norethindrone  (MICRONOR ) 0.35 MG tablet, Take 1 tablet (0.35 mg total) by mouth daily., Disp: 28 tablet, Rfl: 11   omeprazole  (PRILOSEC) 20 MG capsule, Take 1 capsule (20 mg total) by mouth daily., Disp: 30 capsule, Rfl: 0   ondansetron  (ZOFRAN -ODT) 4 MG disintegrating tablet, Take 4 mg by mouth every 8 (eight) hours as needed., Disp: , Rfl:    sulfamethoxazole-trimethoprim (BACTRIM DS) 800-160 MG tablet, Take 1 tablet by mouth 2 (two) times daily., Disp: , Rfl:    tamsulosin (FLOMAX) 0.4 MG CAPS capsule, Take 0.4 mg by mouth daily., Disp: , Rfl:    topiramate  (TOPAMAX ) 25 MG tablet, Take 1 tablet (25 mg total) by mouth 2 (two) times daily., Disp: 60 tablet, Rfl: 1  "

## 2024-01-12 NOTE — Patient Instructions (Signed)
" °  Start topiramate  25 mg twice daily  Follow up in 3 weeks   Stop junel  Start micronor     ------------------------------------  MIGRAINE RECOMMENDATIONS TO CONSIDER  1. Limit use of pain relievers to no more than 2 days out of the week.  These medications include acetaminophen , NSAIDs (ibuprofen /Advil /Motrin , naproxen/Aleve, triptans (Imitrex/sumatriptan), Excedrin, and narcotics.  This will help reduce risk of rebound headaches. 2. Be aware of common food triggers:             - Caffeine :  coffee, black tea, cola, Mt. Dew             - Chocolate             - Dairy:  aged cheeses (brie, blue, cheddar, gouda, Parmasan, provolone, romano, Swiss, etc), chocolate milk, buttermilk, sour cream, limit eggs and yogurt             - Nuts, peanut butter             - Alcohol             - Cereals/grains:  FRESH breads (fresh bagels, sourdough, doughnuts), yeast productions             - Processed/canned/aged/cured meats (pre-packaged deli meats, hotdogs)             - MSG/glutamate:  soy sauce, flavor enhancer, pickled/preserved/marinated foods             - Sweeteners:  aspartame (Equal, Nutrasweet).  Sugar and Splenda are okay             - Vegetables:  legumes (lima beans, lentils, snow peas, fava beans, pinto peans, peas, garbanzo beans), sauerkraut, onions, olives, pickles             - Fruit:  avocados, bananas, citrus fruit (orange, lemon, grapefruit), mango             - Other:  Frozen meals, macaroni and cheese 7. Routine exercise 8. Stay adequately hydrated (aim for 64 oz water daily) 9. Keep headache diary 10. Maintain proper stress management 11. Maintain proper sleep hygiene 12. Do not skip meals 13. Consider supplements:  magnesium citrate 400mg  daily, riboflavin 400mg  daily, coenzyme Q10 100mg  three times daily.   "

## 2024-01-13 ENCOUNTER — Ambulatory Visit: Payer: Self-pay | Admitting: Family

## 2024-01-13 DIAGNOSIS — G43511 Persistent migraine aura without cerebral infarction, intractable, with status migrainosus: Secondary | ICD-10-CM

## 2024-01-13 DIAGNOSIS — R311 Benign essential microscopic hematuria: Secondary | ICD-10-CM

## 2024-01-20 DIAGNOSIS — R002 Palpitations: Secondary | ICD-10-CM | POA: Diagnosis not present

## 2024-02-10 ENCOUNTER — Ambulatory Visit

## 2024-02-15 MED ORDER — PROPRANOLOL HCL 40 MG PO TABS: 40.0000 mg | ORAL_TABLET | Freq: Every day | ORAL | 1 refills | Status: AC

## 2024-02-15 NOTE — Addendum Note (Signed)
 Addended by: CORWIN ANTU on: 02/15/2024 09:42 AM   Modules accepted: Orders

## 2024-03-01 ENCOUNTER — Ambulatory Visit: Admitting: Neurology

## 2024-03-09 ENCOUNTER — Ambulatory Visit

## 2024-10-14 ENCOUNTER — Encounter: Admitting: Family
